# Patient Record
Sex: Female | Born: 1975 | Race: White | Hispanic: No | Marital: Married | Smoking: Former smoker
Health system: Southern US, Community
[De-identification: ages and names within clinical notes are randomized; demographics above are authoritative.]

## PROBLEM LIST (undated history)

## (undated) DIAGNOSIS — F32A Depression, unspecified: Secondary | ICD-10-CM

## (undated) DIAGNOSIS — K819 Cholecystitis, unspecified: Secondary | ICD-10-CM

## (undated) DIAGNOSIS — Z8249 Family history of ischemic heart disease and other diseases of the circulatory system: Secondary | ICD-10-CM

## (undated) DIAGNOSIS — K121 Other forms of stomatitis: Secondary | ICD-10-CM

## (undated) DIAGNOSIS — F419 Anxiety disorder, unspecified: Secondary | ICD-10-CM

## (undated) DIAGNOSIS — F329 Major depressive disorder, single episode, unspecified: Secondary | ICD-10-CM

## (undated) DIAGNOSIS — M797 Fibromyalgia: Secondary | ICD-10-CM

## (undated) HISTORY — PX: SHOULDER ARTHROSCOPY: SHX128

## (undated) HISTORY — DX: Anxiety disorder, unspecified: F41.9

## (undated) HISTORY — DX: Other forms of stomatitis: K12.1

## (undated) HISTORY — DX: Fibromyalgia: M79.7

## (undated) HISTORY — DX: Depression, unspecified: F32.A

## (undated) HISTORY — PX: KNEE ARTHROSCOPY: SUR90

## (undated) HISTORY — PX: GANGLION CYST EXCISION: SHX1691

## (undated) HISTORY — DX: Major depressive disorder, single episode, unspecified: F32.9

---

## 2005-03-02 ENCOUNTER — Other Ambulatory Visit: Admission: RE | Admit: 2005-03-02 | Discharge: 2005-03-02 | Payer: Self-pay | Admitting: Obstetrics and Gynecology

## 2005-06-10 ENCOUNTER — Ambulatory Visit (HOSPITAL_COMMUNITY): Admission: RE | Admit: 2005-06-10 | Discharge: 2005-06-10 | Payer: Self-pay | Admitting: Orthopedic Surgery

## 2005-06-22 ENCOUNTER — Ambulatory Visit (HOSPITAL_COMMUNITY): Admission: RE | Admit: 2005-06-22 | Discharge: 2005-06-22 | Payer: Self-pay | Admitting: Orthopedic Surgery

## 2005-06-23 ENCOUNTER — Ambulatory Visit (HOSPITAL_BASED_OUTPATIENT_CLINIC_OR_DEPARTMENT_OTHER): Admission: RE | Admit: 2005-06-23 | Discharge: 2005-06-23 | Payer: Self-pay | Admitting: Orthopedic Surgery

## 2005-07-21 ENCOUNTER — Ambulatory Visit (HOSPITAL_BASED_OUTPATIENT_CLINIC_OR_DEPARTMENT_OTHER): Admission: RE | Admit: 2005-07-21 | Discharge: 2005-07-21 | Payer: Self-pay | Admitting: Orthopedic Surgery

## 2007-03-20 ENCOUNTER — Inpatient Hospital Stay (HOSPITAL_COMMUNITY): Admission: AD | Admit: 2007-03-20 | Discharge: 2007-03-22 | Payer: Self-pay | Admitting: Obstetrics and Gynecology

## 2008-05-11 ENCOUNTER — Inpatient Hospital Stay (HOSPITAL_COMMUNITY): Admission: AD | Admit: 2008-05-11 | Discharge: 2008-05-13 | Payer: Self-pay | Admitting: Obstetrics & Gynecology

## 2010-05-31 ENCOUNTER — Encounter: Payer: Self-pay | Admitting: Orthopedic Surgery

## 2010-08-24 LAB — CBC
HCT: 26.6 % — ABNORMAL LOW (ref 36.0–46.0)
HCT: 27.2 % — ABNORMAL LOW (ref 36.0–46.0)
HCT: 31.9 % — ABNORMAL LOW (ref 36.0–46.0)
Hemoglobin: 10.8 g/dL — ABNORMAL LOW (ref 12.0–15.0)
Hemoglobin: 9.2 g/dL — ABNORMAL LOW (ref 12.0–15.0)
Hemoglobin: 9.4 g/dL — ABNORMAL LOW (ref 12.0–15.0)
MCHC: 33.9 g/dL (ref 30.0–36.0)
MCHC: 34.4 g/dL (ref 30.0–36.0)
MCHC: 34.6 g/dL (ref 30.0–36.0)
MCV: 96.2 fL (ref 78.0–100.0)
MCV: 96.4 fL (ref 78.0–100.0)
MCV: 96.4 fL (ref 78.0–100.0)
Platelets: 240 10*3/uL (ref 150–400)
Platelets: 265 10*3/uL (ref 150–400)
Platelets: 291 10*3/uL (ref 150–400)
RBC: 2.76 MIL/uL — ABNORMAL LOW (ref 3.87–5.11)
RBC: 2.82 MIL/uL — ABNORMAL LOW (ref 3.87–5.11)
RBC: 3.31 MIL/uL — ABNORMAL LOW (ref 3.87–5.11)
RDW: 12.6 % (ref 11.5–15.5)
RDW: 12.6 % (ref 11.5–15.5)
RDW: 12.6 % (ref 11.5–15.5)
WBC: 11.2 10*3/uL — ABNORMAL HIGH (ref 4.0–10.5)
WBC: 8 10*3/uL (ref 4.0–10.5)
WBC: 9.8 10*3/uL (ref 4.0–10.5)

## 2010-08-24 LAB — DIFFERENTIAL
Basophils Absolute: 0 10*3/uL (ref 0.0–0.1)
Basophils Relative: 0 % (ref 0–1)
Eosinophils Absolute: 0.2 10*3/uL (ref 0.0–0.7)
Eosinophils Relative: 2 % (ref 0–5)
Lymphocytes Relative: 23 % (ref 12–46)
Lymphs Abs: 1.8 10*3/uL (ref 0.7–4.0)
Monocytes Absolute: 0.8 10*3/uL (ref 0.1–1.0)
Monocytes Relative: 10 % (ref 3–12)
Neutro Abs: 5.2 10*3/uL (ref 1.7–7.7)
Neutrophils Relative %: 65 % (ref 43–77)

## 2010-08-24 LAB — RPR: RPR Ser Ql: NONREACTIVE

## 2010-09-25 NOTE — Op Note (Signed)
NAME:  Autumn Dixon, Autumn Dixon                 ACCOUNT NO.:  [id number]   MEDICAL RECORD NO.:  [id number]          PATIENT TYPE:  AMB   LOCATION:  DSC                          FACILITY:  [facility]   PHYSICIAN:  Harvie Junior, M.D.   DATE OF BIRTH:  1976-05-07   DATE OF PROCEDURE:  07/21/2005  DATE OF DISCHARGE:                                 OPERATIVE REPORT   PREOPERATIVE DIAGNOSIS:  Impingement acromioclavicular joint arthritis,  right shoulder.  Ganglion cyst, dorsal wrist.  Catching-locking sensation,  right elbow.   POSTOPERATIVE DIAGNOSIS:  Impingement acromioclavicular joint arthritis,  right shoulder.  Ganglion cyst, dorsal wrist.  Catching-locking sensation,  right elbow.   OPERATION PERFORMED:  1.  Subacromial decompression, arthroscopic.  2.  Distal clavicle resection, arthroscopic.  3.  Debridement of subacromial space, arthroscopic.  4.  Excision of dorsal ganglion cyst, right wrist.  5.  Injection into the right elbow with 4 of 0.5% Marcaine with 1 mL of 80      mg per mL Depo-Medrol.   SURGEON:  Harvie Junior, M.D.   ASSISTANT:  Marshia Ly, P.A.   ANESTHESIA:  General.   INDICATIONS FOR PROCEDURE:  Autumn Dixon is a 35 year old female with a long  history of having significant impingement, pain on the right side.  She was  evaluated with MRI, failed injection therapy and because of continued  complaints of pain was ultimately taken to the operating room for  subacromial decompression, distal clavicle resection.  She was noted in  preoperative work-up to have catching and popping in the right elbow and we  felt that there was maybe some ligamentous issue going on here and felt that  injection therapy might help and thought that we would inject this while she  was under anesthesia.  The patient also had right wrist pain and had a  gadolinium MR preoperatively that showed no ligamentous injury but did show  a significant dorsal ganglion under the extensor retinaculum.  It  was felt  that we might as well excise this at the time of her surgery as well and she  is brought to the operating room for these procedures.   DESCRIPTION OF PROCEDURE:  The patient was brought to the operating room and  after adequate anesthesia was obtained with general anesthetic, patient  placed supine on the operating table.  The right elbow was then injected  with 4 mL of 0.5% Marcaine with 1 mL of 80 mg/mL Depo-Medrol.  Pressure was  held on this while the patient was being positioned for her remaining  surgical procedures.  The patient was then placed in the beach chair  position.  All bony prominences were well padded.  Attention was then turned  to the right arm which was prepped and draped in the usual sterile fashion.  Following this, attention was turned to the right wrist where sterile  tourniquet was placed  on the forearm and the arm was exsanguinated and the  blood pressure tourniquet was inflated to 300 mmHg.  Following this, a small  incision was made over the dorsal aspect  of the wrist.  Subcutaneous tissues  were dissected down to the level of the extensor tendons.  The extensor  retinaculum was divided and the tendons were retracted medial and laterally  giving access to the large ganglion cyst under this area.  At this point the  ganglion was excised in total opening up the dorsal wrist capsule.  The  scapholunate ligament could be identified at this point.  The edges of the  opening in the capsule were cauterized.  At this point the wound was  copiously irrigated and suctioned dry.  The skin was closed with 3-0  Mersilene subcuticular stitch and benzoin and Steri-Strips were applied.  Tourniquet was let down and sterile compressive dressing was applied.  Attention was then turned to the right shoulder where routine arthroscopic  examination of the shoulder revealed that there was no significant  glenohumeral pathology.  There was no arthritic change.  There was no  soft  tissue bicipital tendon problems.  The labrum looked good.  The undersurface  of the rotator cuff looked good.  Attention was turned now to the  glenohumeral joint and the subacromial space.  Obvious significant spurring  of the anterolateral acromion which was debrided by way of acromioplasty  from a lateral and posterior compartment.  Following this, attention was  turned to the distal clavicle where 17 mm of distal clavicle was resected  from the anterior compartment.  At this point the rotator cuff was evaluated  thoroughly from the superior surface, no evidence of partial or full  thickness tearing was identified.  There was a little bit of raggedness on  the rotator cuff which was debrided but nothing that seemed of any  significant.  Subtotal bursectomy was performed and at this point the  shoulder was copiously irrigated and suctioned dry.  The arthroscopic  portals were closed with a bandage.  A sterile compressive dressing was  applied.  The patient was taken to the recovery room where she was noted to  be in satisfactory condition.  The estimated blood loss for this procedure  was none.      Harvie Junior, M.D.  Electronically Signed     JLG/MEDQ  D:  07/21/2005  T:  07/22/2005  Job:  [id number]

## 2010-09-25 NOTE — Op Note (Signed)
NAMETEQUITA, Autumn Dixon                 ACCOUNT NO.:  [id number]   MEDICAL RECORD NO.:  [id number]          PATIENT TYPE:  AMB   LOCATION:  DSC                          FACILITY:  [facility]   PHYSICIAN:  Harvie Junior, M.D.   DATE OF BIRTH:  10/02/1975   DATE OF PROCEDURE:  06/23/2005  DATE OF DISCHARGE:                                 OPERATIVE REPORT   PREOPERATIVE DIAGNOSIS:  Patellofemoral pain syndrome.   POSTOPERATIVE DIAGNOSES:  1.  Lateral bucket handle meniscal tear.  2.  Chondromalacia patellofemoral trochlea.   OPERATION PERFORMED:  1.  Lateral bucket handle meniscal resection.  2.  Debridement of chondromalacia of patellofemoral joint.   SURGEON:  Harvie Junior, M.D.   ASSISTANT:  Marshia Ly, P.A.   ANESTHESIA:  General.   INDICATIONS FOR PROCEDURE:  Autumn Dixon is a 35 year old female with a long  history of having multiple joint complaints.  She ultimately had prolonged  complaints with the right knee with intermittent catching locking and pain.  Because of this, we felt that she needed a right knee arthroscopy.  She also  has had multiple systemic complaints and we were concerned about the  possibility of her having some sort of early arthritic kind of condition.  At this point we were going to have her evaluated by rheumatologist just to  kind of see but felt that she did need a knee scope just based on her  symptoms and duration of symptoms.  The patient was brought to the operating  room for that procedure.   DESCRIPTION OF PROCEDURE:  The patient was brought to the operating room and  after adequate anesthesia obtained with general anesthetic, the patient was  placed supine position on operating table.  Right leg prepped and draped in  the usual sterile fashion.  Following this routine arthroscopic examination  of the knee revealed there was an obvious bucket handle lateral meniscal  tear which was debrided, was released and taken out in total.  Remaining  meniscal rim was contoured down with a suction shaver.  The articular  surfaces looked reasonably well.  There was a little bit of articular  cartilage floating but nothing dramatic.  There was some grade 2 changes in  the patellofemoral trochlea and a probe could easily go down about 3 mm deep  and there we debrided this minimally.  Patellofemoral tracking was midline.  There was a lot of patellofemoral compression.  Once these two procedures  were completed,  the knee was copiously irrigated and suctioned dry.  The arthroscopic  portals were closed with a bandage. Sterile compressive dressing was  applied.  The patient was taken to the recovery room where she was noted to  be in satisfactory condition.  The estimated blood loss for this procedure  was none.      Harvie Junior, M.D.  Electronically Signed     JLG/MEDQ  D:  06/23/2005  T:  06/23/2005  Job:  [id number]

## 2011-02-16 LAB — CBC
HCT: 27.6 — ABNORMAL LOW
HCT: 34.5 — ABNORMAL LOW
Hemoglobin: 11.8 — ABNORMAL LOW
Hemoglobin: 9.6 — ABNORMAL LOW
MCHC: 34.2
MCHC: 34.7
MCV: 95.8
MCV: 96.5
Platelets: 247
Platelets: 334
RBC: 2.88 — ABNORMAL LOW
RBC: 3.58 — ABNORMAL LOW
RDW: 12.6
RDW: 12.7
WBC: 10.2
WBC: 11.1 — ABNORMAL HIGH

## 2011-02-16 LAB — COMPREHENSIVE METABOLIC PANEL
ALT: 15
AST: 21
Albumin: 2.8 — ABNORMAL LOW
Alkaline Phosphatase: 196 — ABNORMAL HIGH
BUN: 2 — ABNORMAL LOW
CO2: 23
Calcium: 9.1
Chloride: 104
Creatinine, Ser: 0.49
GFR calc Af Amer: >60
GFR calc non Af Amer: >60
Glucose, Bld: 97
Potassium: 3.9
Sodium: 135
Total Bilirubin: 0.7
Total Protein: 5.8 — ABNORMAL LOW

## 2011-02-16 LAB — RPR: RPR Ser Ql: NONREACTIVE

## 2011-02-16 LAB — CCBB MATERNAL DONOR DRAW

## 2011-02-16 LAB — LACTATE DEHYDROGENASE: LDH: 171

## 2011-02-16 LAB — URIC ACID: Uric Acid, Serum: 4.4

## 2011-04-21 ENCOUNTER — Ambulatory Visit (INDEPENDENT_AMBULATORY_CARE_PROVIDER_SITE_OTHER): Payer: BC Managed Care – PPO

## 2011-04-21 DIAGNOSIS — L299 Pruritus, unspecified: Secondary | ICD-10-CM

## 2011-04-21 DIAGNOSIS — B850 Pediculosis due to Pediculus humanus capitis: Secondary | ICD-10-CM

## 2011-05-27 ENCOUNTER — Ambulatory Visit: Payer: BC Managed Care – PPO | Admitting: Family Medicine

## 2011-06-03 ENCOUNTER — Ambulatory Visit (INDEPENDENT_AMBULATORY_CARE_PROVIDER_SITE_OTHER): Payer: BC Managed Care – PPO | Admitting: Family Medicine

## 2011-06-03 DIAGNOSIS — F411 Generalized anxiety disorder: Secondary | ICD-10-CM

## 2012-06-15 ENCOUNTER — Other Ambulatory Visit: Payer: Self-pay | Admitting: Family Medicine

## 2012-06-26 ENCOUNTER — Telehealth: Payer: Self-pay

## 2012-06-26 NOTE — Telephone Encounter (Signed)
This patient is scheduled to see dr Milus Glazier on 07/26/12 @1 :00, however she is out of one of her medications, sertraline and is requesting a refill on this medication, until her re-eval with dr Armond Hang.

## 2012-06-29 ENCOUNTER — Telehealth: Payer: Self-pay

## 2012-06-29 MED ORDER — SERTRALINE HCL 100 MG PO TABS: 100.0000 mg | ORAL_TABLET | Freq: Every day | ORAL | Status: DC

## 2012-06-29 NOTE — Telephone Encounter (Signed)
Called patient to advise  °

## 2012-06-29 NOTE — Telephone Encounter (Signed)
Please advise patient is scheduled for 07/26/12 with Dr Milus Glazier, she has been on Sertraline 100 mg, chart pulled it is at [facility]. She was here last Jun 03 2011. Rx pended.

## 2012-06-29 NOTE — Telephone Encounter (Signed)
See note below, I do not think this message was routed correctly.  Pt called again today 2/20 and is awaiting her refill  Pt [phone] office #

## 2012-06-29 NOTE — Telephone Encounter (Signed)
Rx done. Must have OV for additional refills

## 2012-07-26 ENCOUNTER — Encounter: Payer: Self-pay | Admitting: Family Medicine

## 2012-07-26 ENCOUNTER — Ambulatory Visit (INDEPENDENT_AMBULATORY_CARE_PROVIDER_SITE_OTHER): Payer: BC Managed Care – PPO | Admitting: Family Medicine

## 2012-07-26 VITALS — BP 120/76 | HR 86 | Temp 98.5°F | Resp 16 | Ht 69.0 in | Wt 183.0 lb

## 2012-07-26 DIAGNOSIS — F411 Generalized anxiety disorder: Secondary | ICD-10-CM

## 2012-07-26 DIAGNOSIS — G4700 Insomnia, unspecified: Secondary | ICD-10-CM

## 2012-07-26 MED ORDER — SERTRALINE HCL 100 MG PO TABS: 100.0000 mg | ORAL_TABLET | Freq: Every day | ORAL | Status: DC

## 2012-07-26 MED ORDER — ALPRAZOLAM 2 MG PO TABS: ORAL_TABLET | ORAL | Status: DC

## 2012-07-26 NOTE — Patient Instructions (Addendum)

## 2012-07-26 NOTE — Progress Notes (Signed)
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 Her youngest of 11 and 46 years old. She's had a problem with mild anxiety and insomnia which has been well-controlled with the sertraline and alprazolam. She takes alprazolam 2 mg in the morning and 1 mg at night. Generally she goes to bed around 7:30 or 8 and has trouble with frequent awakening, approximately every 2 hours. She gets up at 4:00 every for work.  Her main concern now is her weight gain. She finds it when she wakes up at night she snacks with parts and other snacks.  Otherwise patient is comfortable with her life is going and not in any acute problems.  Objective: Discussed the weight gain for about 15 minutes suggested the patient try either her blood Bank at night or twitching around her alprazolam so that she's taking 1 mg in the morning and 2 mg at night to help her sleep better.  Assessment: Relatively stable at the present time, no need for new lab work  Plan: Refill meds for 5 months and return to review weight situation at that time.

## 2012-08-09 ENCOUNTER — Telehealth: Payer: Self-pay

## 2012-08-09 NOTE — Telephone Encounter (Signed)
Dr L. Patient needs call back to  Discuss meds. [phone]

## 2012-08-10 ENCOUNTER — Other Ambulatory Visit: Payer: Self-pay | Admitting: Family Medicine

## 2012-08-10 DIAGNOSIS — F32A Depression, unspecified: Secondary | ICD-10-CM

## 2012-08-10 DIAGNOSIS — F329 Major depressive disorder, single episode, unspecified: Secondary | ICD-10-CM

## 2012-08-10 MED ORDER — BUPROPION HCL ER (XL) 150 MG PO TB24: 150.0000 mg | ORAL_TABLET | Freq: Every day | ORAL | Status: DC

## 2012-08-10 NOTE — Telephone Encounter (Signed)
Spoke to patient, she was here recently, she states she has been on Zoloft for years, sexual side effects with this and weigh gain (15 lbs) wants to know if she can change to Wellbutrin, please advise.

## 2013-01-24 ENCOUNTER — Other Ambulatory Visit: Payer: Self-pay | Admitting: Family Medicine

## 2013-01-24 NOTE — Telephone Encounter (Signed)
PATIENT WOULD LIKE A REFILL ON HER XANEX IF POSSIBLE DR L IS HER DOCTOR [phone]

## 2013-02-27 ENCOUNTER — Telehealth: Payer: Self-pay

## 2013-02-27 DIAGNOSIS — K649 Unspecified hemorrhoids: Secondary | ICD-10-CM

## 2013-02-27 MED ORDER — HYDROCORTISONE 2.5 % RE CREA: TOPICAL_CREAM | Freq: Two times a day (BID) | RECTAL | Status: DC

## 2013-02-27 NOTE — Telephone Encounter (Signed)
PATIENT STATES SHE WOULD  LIKE DR. Milus Glazier TO PRESCRIBE SOMETHING "PRESCRIPTION STRENGTH" FOR HEMORRHOIDS THAT HAS BEEN BOTHERING HER FOR 1 MONTH. OVER-THE-COUNTER MEDICATIONS ARE NOT HELPING. BEST PHONE [phone] (CELL)    PHARMACY CHOICE IS [facility] ON [address].  MBC

## 2013-02-28 ENCOUNTER — Ambulatory Visit (INDEPENDENT_AMBULATORY_CARE_PROVIDER_SITE_OTHER): Payer: BC Managed Care – PPO | Admitting: Family Medicine

## 2013-02-28 VITALS — BP 116/66 | HR 75 | Temp 98.5°F | Resp 18 | Ht 69.0 in | Wt 177.6 lb

## 2013-02-28 DIAGNOSIS — K649 Unspecified hemorrhoids: Secondary | ICD-10-CM

## 2013-02-28 DIAGNOSIS — G4700 Insomnia, unspecified: Secondary | ICD-10-CM

## 2013-02-28 DIAGNOSIS — F411 Generalized anxiety disorder: Secondary | ICD-10-CM

## 2013-02-28 MED ORDER — ALPRAZOLAM 2 MG PO TABS: ORAL_TABLET | ORAL | Status: DC

## 2013-02-28 MED ORDER — SERTRALINE HCL 100 MG PO TABS: 100.0000 mg | ORAL_TABLET | Freq: Every day | ORAL | Status: DC

## 2013-02-28 MED ORDER — STARCH 51 % RE SUPP: 1.0000 | RECTAL | Status: DC | PRN

## 2013-02-28 NOTE — Progress Notes (Signed)
Patient ID: Autumn Dixon, female   DOB: 07-28-1975, 37 y.o.   MRN: [id number]  @UMFCLOGO @  Patient ID: Autumn Dixon MRN: [id number], DOB: [birth year], 37 y.o. Date of Encounter: 02/28/2013, 10:00 AM This chart was scribed for Elvina Sidle, MD by Valera Castle, ED Scribe. This patient was seen in room 2 and the patient's care was started at 10:00 AM.   Primary Physician: Elvina Sidle, MD  Chief Complaint: Rx Refill, Hemorrhoids  HPI: 37 y.o. year old female with history below presents to the [facility] for a Rx refill of Xanax and Zoloft. She also reports persistent, intermittent, severely painful hemorrhoids, onset 14 years ago, after having her 2nd child. She reports that they come and go, about 1 every few months, and that they last for several days. She states the pain will force her to leave work at times. She reports that that the hemorrhoids are sometimes bloody, but that the pain is her main symptom. She states that she has been to [facility] for sprays, creams, wipes, and numerous other treatments, and trying ice and salt baths, all with no relief.    Past Medical History  Diagnosis Date  . Anxiety   . Depression      Home Meds: Prior to Admission medications   Medication Sig Start Date End Date Taking? Authorizing Provider  alprazolam Prudy Feeler) 2 MG tablet 1/2 tab in am and 1 tab at hs 07/26/12  Yes Elvina Sidle, MD  buPROPion (WELLBUTRIN XL) 150 MG 24 hr tablet Take 1 tablet (150 mg total) by mouth daily. 08/10/12  Yes Elvina Sidle, MD  hydrocortisone (ANUSOL-HC) 2.5 % rectal cream Place rectally 2 (two) times daily. 02/27/13   Elvina Sidle, MD    Allergies: No Known Allergies  History   Social History  . Marital Status: Married    Spouse Name: N/A    Number of Children: N/A  . Years of Education: N/A   Occupational History  . Not on file.   Social History Main Topics  . Smoking status: Never Smoker   . Smokeless tobacco: Not on file  . Alcohol Use: Not on file  .  Drug Use: Not on file  . Sexual Activity: Not on file   Other Topics Concern  . Not on file   Social History Narrative  . No narrative on file     Review of Systems: Constitutional: negative for chills, fever, night sweats, weight changes, or fatigue  HEENT: negative for vision changes, hearing loss, congestion, rhinorrhea, ST, epistaxis, or sinus pressure Cardiovascular: negative for chest pain or palpitations Respiratory: negative for hemoptysis, wheezing, shortness of breath, or cough Abdominal: negative for abdominal pain, nausea, vomiting, diarrhea, or constipation GU: Positive for severely painful, sometimes bloody, intermittent hemorrhoids  Dermatological: negative for rash Neurologic: negative for headache, dizziness, or syncope All other systems reviewed and are otherwise negative with the exception to those above and in the HPI.   Physical Exam: Blood pressure 116/66, pulse 75, temperature 98.5 F (36.9 C), temperature source Oral, resp. rate 18, height 5\' 9"  (1.753 m), weight 177 lb 9.6 oz (80.559 kg), SpO2 99.00%., Body mass index is 26.22 kg/(m^2). General: Well developed, well nourished, in no acute distress. Head: Normocephalic, atraumatic, eyes without discharge, sclera non-icteric, nares are without discharge. Bilateral auditory canals clear, TM's are without perforation, pearly grey and translucent with reflective cone of light bilaterally. Oral cavity moist, posterior pharynx without exudate, erythema, peritonsillar abscess, or post nasal drip.  Neck: Supple. No thyromegaly. Full ROM.  No lymphadenopathy. Lungs: Clear bilaterally to auscultation without wheezes, rales, or rhonchi. Breathing is unlabored. Heart: RRR with S1 S2. No murmurs, rubs, or gallops appreciated. Abdomen: Soft, non-tender, non-distended with normoactive bowel sounds. No hepatomegaly. No rebound/guarding. No obvious abdominal masses. Msk:  Strength and tone normal for age. Extremities/Skin: Warm  and dry. No clubbing or cyanosis. No edema. No rashes or suspicious lesions. Neuro: Alert and oriented X 3. Moves all extremities spontaneously. Gait is normal. CNII-XII grossly in tact. Psych:  Responds to questions appropriately with a normal affect.  Large red left sided 3/4 cm violaceous external hemorrhoid with right sided ecchymotic area at anal verge DIAGNOSTIC STUDIES: Oxygen Saturation is 99% on room air, normal by my interpretation.    Labs:  No orders of the defined types were placed in this encounter.    ASSESSMENT AND PLAN:  37 y.o. year old female with    Signed, Elvina Sidle, MD 02/28/2013 9:59 AM

## 2013-03-06 ENCOUNTER — Telehealth: Payer: Self-pay

## 2013-03-06 MED ORDER — HYDROCORTISONE ACETATE 25 MG RE SUPP: 25.0000 mg | Freq: Two times a day (BID) | RECTAL | Status: DC

## 2013-03-06 NOTE — Telephone Encounter (Signed)
Meds ordered this encounter  Medications  . hydrocortisone (ANUSOL-HC) 25 MG suppository    Sig: Place 1 suppository (25 mg total) rectally 2 (two) times daily.    Dispense:  12 suppository    Refill:  1    Order Specific Question:  Supervising Provider    Answer:  DOOLITTLE, ROBERT P [[id number]]

## 2013-03-06 NOTE — Telephone Encounter (Signed)
Pt advised.

## 2013-03-06 NOTE — Telephone Encounter (Signed)
PT WAS SEEN FOR HEMORRHOIDS AND GIVEN A SUPPORTIVE WHICH THEY DO NOT MAKE ANY MORE, SHE HAVE CALLED AROUND TO ALL PHARMACIES AND TOLD THE SAME THING [facility] STATES THEY MAKE ANUCORT HC 30MG S WHICH IS ABOUT THE SAME. SHE IS IN A LOT OF PAIN AND NEED TO HAVE SOME KIND OF RELIEF ASAP PLEASE CALL [phone] OR [phone]   [facility]

## 2013-10-31 ENCOUNTER — Other Ambulatory Visit: Payer: Self-pay | Admitting: Family Medicine

## 2013-11-09 ENCOUNTER — Other Ambulatory Visit: Payer: Self-pay | Admitting: Family Medicine

## 2013-12-24 ENCOUNTER — Encounter: Payer: Self-pay | Admitting: Family Medicine

## 2013-12-24 ENCOUNTER — Ambulatory Visit (INDEPENDENT_AMBULATORY_CARE_PROVIDER_SITE_OTHER): Payer: BC Managed Care – PPO | Admitting: Family Medicine

## 2013-12-24 VITALS — BP 111/71 | HR 76 | Temp 98.0°F | Resp 16 | Ht 70.5 in | Wt 188.0 lb

## 2013-12-24 DIAGNOSIS — Z3041 Encounter for surveillance of contraceptive pills: Secondary | ICD-10-CM

## 2013-12-24 DIAGNOSIS — G4700 Insomnia, unspecified: Secondary | ICD-10-CM

## 2013-12-24 DIAGNOSIS — IMO0001 Reserved for inherently not codable concepts without codable children: Secondary | ICD-10-CM | POA: Insufficient documentation

## 2013-12-24 DIAGNOSIS — F411 Generalized anxiety disorder: Secondary | ICD-10-CM

## 2013-12-24 MED ORDER — ALPRAZOLAM 2 MG PO TABS: ORAL_TABLET | ORAL | Status: DC

## 2013-12-24 MED ORDER — SERTRALINE HCL 100 MG PO TABS: 100.0000 mg | ORAL_TABLET | Freq: Every day | ORAL | Status: DC

## 2013-12-24 NOTE — Progress Notes (Signed)
Patient ID: Autumn Dixon MRN: [id number], DOB: December 01, 1975, 38 y.o. Date of Encounter: 12/24/2013, 10:16 AM  This chart was scribed for Elvina SidleKurt Braxden Lovering, MD by Julian HyMorgan Graham, ED Scribe. The patient was seen in Room 29. The patient's care was started at 10:16 AM.   Primary Physician: Elvina SidleLAUENSTEIN,Sarha Bartelt, MD  Chief Complaint: Medication refill   HPI: 38 y.o. year old female with history below presents for refill of 2 mg Xanax and 100 mg Zoloft. Doing well without issues or complaints. Taking medication daily without adverse effects. Has been on medication for anxiety and depression.   Pt reports her oldest daughter is leaving for [employer] on 8/19. Pt reports she was hit in the right eye by her dog's rawhide bone when her dog was startled. Pt reports she couldn't see right after. Pt reports using ice compress and states her symptoms have resolved. Pt reports some bruising around her right eye. 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  Pt denies any other symptoms.   Past Medical History  Diagnosis Date  . Anxiety   . Depression      Home Meds: Prior to Admission medications   Medication Sig Start Date End Date Taking? Authorizing Provider  alprazolam Prudy Feeler(XANAX) 2 MG tablet 1/2 tab in am and 1 tab at hs 02/28/13  Yes Elvina SidleKurt Tyhir Schwan, MD  levonorgestrel (MIRENA) 20 MCG/24HR IUD 1 each by Intrauterine route once.   Yes Historical Provider, MD  sertraline (ZOLOFT) 100 MG tablet Take 1 tablet (100 mg total) by mouth daily. Needs office visit for additional refills 02/28/13  Yes Elvina SidleKurt Rameen Quinney, MD  hydrocortisone (ANUSOL-HC) 2.5 % rectal cream Place rectally 2 (two) times daily. 02/27/13   Elvina SidleKurt Brunella Wileman, MD  hydrocortisone (ANUSOL-HC) 25 MG suppository Place 1 suppository (25 mg total) rectally 2 (two) times daily. 03/06/13   Chelle Tessa LernerS Jeffery, PA-C    Allergies: No Known Allergies  History   Social History  . Marital Status: Married    Spouse Name: N/A    Number  of Children: N/A  . Years of Education: N/A   Occupational History  . Not on file.   Social History Main Topics  . Smoking status: Never Smoker   . Smokeless tobacco: Not on file  . Alcohol Use: Not on file  . Drug Use: Not on file  . Sexual Activity: Not on file   Other Topics Concern  . Not on file   Social History Narrative  . No narrative on file     Review of Systems: Constitutional: negative for chills, fever, night sweats, weight changes, or fatigue  HEENT: negative for vision changes, hearing loss, congestion, rhinorrhea, ST, epistaxis, or sinus pressure Cardiovascular: negative for chest pain or palpitations Respiratory: negative for hemoptysis, wheezing, shortness of breath, or cough Abdominal: negative for abdominal pain, nausea, vomiting, diarrhea, or constipation Dermatological: negative for rash. Positive for bruising (right eye). Neurologic: negative for headache, dizziness, or syncope All other systems reviewed and are otherwise negative with the exception to those above and in the HPI.   Physical Exam: Blood pressure 111/71, pulse 76, temperature 98 F (36.7 C), temperature source Oral, resp. rate 16, height 5' 10.5" (1.791 m), weight 188 lb (85.276 kg), SpO2 97.00%., Body mass index is 26.58 kg/(m^2). General: Well developed, well nourished, in no acute distress. Head: Normocephalic, atraumatic, eyes without discharge, sclera non-icteric, nares are without discharge. Bilateral auditory canals clear, TM's are without perforation, pearly grey and translucent with reflective cone of  light bilaterally. Oral cavity moist, posterior pharynx without exudate, erythema, peritonsillar abscess, or post nasal drip. Pt has ecchymosis underneath her right eye. Normal fundi.  Neck: Supple. No thyromegaly. Full ROM. No lymphadenopathy. Lungs: Clear bilaterally to auscultation without wheezes, rales, or rhonchi. Breathing is unlabored. Heart: RRR with S1 S2. No murmurs, rubs,  or gallops appreciated. Abdomen: Soft, non-tender, non-distended with normoactive bowel sounds. No hepatosplenomegalymegaly. No rebound/guarding. No obvious abdominal masses. Msk:  Strength and tone normal for age. Extremities/Skin: Warm and dry. No clubbing or cyanosis. No edema. No rashes or suspicious lesions. Neuro: Alert and oriented X 3. Moves all extremities spontaneously. Gait is normal. CNII-XII grossly in tact. Psych:  Responds to questions appropriately with a normal affect.   Labs:   ASSESSMENT AND PLAN:  38 y.o. year old female with anxiety here for medication refill. - Anxiety state, unspecified - Plan: sertraline (ZOLOFT) 100 MG tablet  Insomnia - Plan: alprazolam Prudy Feeler) 2 MG tablet  Encounter for surveillance of contraceptive pills   Signed, Elvina Sidle, MD 12/24/2013 10:24 AM

## 2014-01-30 ENCOUNTER — Ambulatory Visit (INDEPENDENT_AMBULATORY_CARE_PROVIDER_SITE_OTHER): Payer: BC Managed Care – PPO | Admitting: Family Medicine

## 2014-01-30 VITALS — BP 102/68 | HR 69 | Temp 98.2°F | Resp 18 | Ht 69.0 in | Wt 193.0 lb

## 2014-01-30 DIAGNOSIS — M545 Low back pain, unspecified: Secondary | ICD-10-CM

## 2014-01-30 DIAGNOSIS — R5381 Other malaise: Secondary | ICD-10-CM

## 2014-01-30 DIAGNOSIS — J029 Acute pharyngitis, unspecified: Secondary | ICD-10-CM

## 2014-01-30 DIAGNOSIS — R5383 Other fatigue: Secondary | ICD-10-CM

## 2014-01-30 DIAGNOSIS — R599 Enlarged lymph nodes, unspecified: Secondary | ICD-10-CM

## 2014-01-30 DIAGNOSIS — J0120 Acute ethmoidal sinusitis, unspecified: Secondary | ICD-10-CM

## 2014-01-30 DIAGNOSIS — R590 Localized enlarged lymph nodes: Secondary | ICD-10-CM

## 2014-01-30 LAB — COMPLETE METABOLIC PANEL WITH GFR
ALT: 14 U/L (ref 0–35)
AST: 18 U/L (ref 0–37)
Albumin: 4.5 g/dL (ref 3.5–5.2)
Alkaline Phosphatase: 62 U/L (ref 39–117)
BUN: 5 mg/dL — ABNORMAL LOW (ref 6–23)
CO2: 28 mEq/L (ref 19–32)
Calcium: 9.8 mg/dL (ref 8.4–10.5)
Chloride: 104 mEq/L (ref 96–112)
Creat: 0.58 mg/dL (ref 0.50–1.10)
GFR, Est African American: >89 mL/min
GFR, Est Non African American: >89 mL/min
Glucose, Bld: 84 mg/dL (ref 70–99)
Potassium: 4.4 mEq/L (ref 3.5–5.3)
Sodium: 139 mEq/L (ref 135–145)
Total Bilirubin: 0.5 mg/dL (ref 0.2–1.2)
Total Protein: 7.1 g/dL (ref 6.0–8.3)

## 2014-01-30 LAB — POCT UA - MICROSCOPIC ONLY
Casts, Ur, LPF, POC: NEGATIVE
Crystals, Ur, HPF, POC: NEGATIVE
Mucus, UA: NEGATIVE
Yeast, UA: NEGATIVE

## 2014-01-30 LAB — POCT CBC
Granulocyte percent: 67.7 %G (ref 37–80)
HCT, POC: 41.1 % (ref 37.7–47.9)
Hemoglobin: 13.6 g/dL (ref 12.2–16.2)
Lymph, poc: 2.1 (ref 0.6–3.4)
MCH, POC: 31.2 pg (ref 27–31.2)
MCHC: 33.1 g/dL (ref 31.8–35.4)
MCV: 94.2 fL (ref 80–97)
MID (cbc): 0.3 (ref 0–0.9)
MPV: 7.7 fL (ref 0–99.8)
POC Granulocyte: 4.9 (ref 2–6.9)
POC LYMPH PERCENT: 28.3 %L (ref 10–50)
POC MID %: 4 %M (ref 0–12)
Platelet Count, POC: 314 10*3/uL (ref 142–424)
RBC: 4.36 M/uL (ref 4.04–5.48)
RDW, POC: 13.4 %
WBC: 7.3 10*3/uL (ref 4.6–10.2)

## 2014-01-30 LAB — POCT URINALYSIS DIPSTICK
Bilirubin, UA: NEGATIVE
Blood, UA: NEGATIVE
Glucose, UA: NEGATIVE
Ketones, UA: NEGATIVE
Leukocytes, UA: NEGATIVE
Nitrite, UA: NEGATIVE
Protein, UA: NEGATIVE
Spec Grav, UA: <=1.005
Urobilinogen, UA: 0.2
pH, UA: 7

## 2014-01-30 LAB — POCT RAPID STREP A (OFFICE): Rapid Strep A Screen: NEGATIVE

## 2014-01-30 MED ORDER — AZITHROMYCIN 250 MG PO TABS: ORAL_TABLET | ORAL | Status: DC

## 2014-01-30 NOTE — Patient Instructions (Signed)

## 2014-01-30 NOTE — Progress Notes (Signed)
Chief Complaint:  Chief Complaint  Patient presents with  . Neck Pain    x3 days   . Back Pain    x3 days  . Otalgia    since friday   . Nasal Congestion  . Fatigue  . Spots and/or Floaters    x1 week     HPI: Autumn Dixon is a 38 y.o. female who is here for  3-4 week history of generalized fatigue thought it was a viral illness, has had bilateral back pain, she has had ear pain and also has had some nasal congestion, no teeth pain, no fevers, some chills, denies dysuria.. She has had some squigly line but no vision cahnges. No confusion, NO SOB, No CP. NO wheezing. 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 No h.o allergies or sinus issues.   Past Medical History  Diagnosis Date  . Anxiety   . Depression    History reviewed. No pertinent past surgical history. History   Social History  . Marital Status: Married    Spouse Name: N/A    Number of Children: N/A  . Years of Education: N/A   Social History Main Topics  . Smoking status: Never Smoker   . Smokeless tobacco: None  . Alcohol Use: None  . Drug Use: None  . Sexual Activity: None   Other Topics Concern  . None   Social History Narrative  . None   History reviewed. No pertinent family history. No Known Allergies Prior to Admission medications   Medication Sig Start Date End Date Taking? Authorizing Provider  alprazolam Prudy Feeler) 2 MG tablet 1/2 tab in am and 1 tab at hs 12/24/13  Yes Elvina Sidle, MD  hydrocortisone (ANUSOL-HC) 2.5 % rectal cream Place rectally 2 (two) times daily. 02/27/13  Yes Elvina Sidle, MD  hydrocortisone (ANUSOL-HC) 25 MG suppository Place 1 suppository (25 mg total) rectally 2 (two) times daily. 03/06/13  Yes Chelle Tessa Lerner, PA-C  levonorgestrel (MIRENA) 20 MCG/24HR IUD 1 each by Intrauterine route once.   Yes Historical Provider, MD  sertraline (ZOLOFT) 100 MG tablet Take 1 tablet (100 mg total) by mouth daily. Needs office visit  for additional refills 12/24/13  Yes Elvina Sidle, MD     ROS: The patient denies night sweats, unintentional weight loss, chest pain, palpitations, wheezing, dyspnea on exertion, nausea, vomiting, abdominal pain, dysuria, hematuria, melena, numbness,  or tingling.   All other systems have been reviewed and were otherwise negative with the exception of those mentioned in the HPI and as above.    PHYSICAL EXAM: Filed Vitals:   01/30/14 1037  BP: 102/68  Pulse: 69  Temp: 98.2 F (36.8 C)  Resp: 18   Filed Vitals:   01/30/14 1037  Height:  (1.753 m)  Weight: 193 lb (87.544 kg)   Body mass index is 28.49 kg/(m^2).  General: Alert, no acute distress HEENT:  Normocephalic, atraumatic, oropharynx patent. EOMI, PERRLA, tm nl , + sinus tenderness right side, + vesicles in OP.  Cardiovascular:  Regular rate and rhythm, no rubs murmurs or gallops.  No Carotid bruits, radial pulse intact. No pedal edema.  Respiratory: Clear to auscultation bilaterally.  No wheezes, rales, or rhonchi.  No cyanosis, no use of accessory musculature GI: No organomegaly, abdomen is soft and non-tender, positive bowel sounds.  No masses. Skin: No rashes. Neurologic: Facial musculature symmetric. Psychiatric: Patient is appropriate throughout our interaction. Lymphatic: + anterior right  sided posterior and  occiptal lymphadenopathy Musculoskeletal: Gait intact.   LABS: Results for orders placed in visit on 01/30/14  POCT URINALYSIS DIPSTICK      Result Value Ref Range   Color, UA yellow     Clarity, UA clear     Glucose, UA neg     Bilirubin, UA neg     Ketones, UA neg     Spec Grav, UA <=1.005     Blood, UA neg     pH, UA 7.0     Protein, UA neg     Urobilinogen, UA 0.2     Nitrite, UA neg     Leukocytes, UA Negative    POCT UA - MICROSCOPIC ONLY      Result Value Ref Range   WBC, Ur, HPF, POC 2-4     RBC, urine, microscopic 0-2     Bacteria, U Microscopic small     Mucus, UA neg      Epithelial cells, urine per micros 2-7     Crystals, Ur, HPF, POC neg     Casts, Ur, LPF, POC neg     Yeast, UA neg    POCT CBC      Result Value Ref Range   WBC 7.3  4.6 - 10.2 K/uL   Lymph, poc 2.1  0.6 - 3.4   POC LYMPH PERCENT 28.3  10 - 50 %L   MID (cbc) 0.3  0 - 0.9   POC MID % 4.0  0 - 12 %M   POC Granulocyte 4.9  2 - 6.9   Granulocyte percent 67.7  37 - 80 %G   RBC 4.36  4.04 - 5.48 M/uL   Hemoglobin 13.6  12.2 - 16.2 g/dL   HCT, POC 62.1  30.8 - 47.9 %   MCV 94.2  80 - 97 fL   MCH, POC 31.2  27 - 31.2 pg   MCHC 33.1  31.8 - 35.4 g/dL   RDW, POC 65.7     Platelet Count, POC 314  142 - 424 K/uL   MPV 7.7  0 - 99.8 fL  POCT RAPID STREP A (OFFICE)      Result Value Ref Range   Rapid Strep A Screen Negative  Negative     EKG/XRAY:   Primary read interpreted by Dr. Conley Rolls at [facility].   ASSESSMENT/PLAN: Encounter Diagnoses  Name Primary?  . Other fatigue Yes  . Low back pain without sciatica, unspecified back pain laterality   . LAD (lymphadenopathy), cervical   . Sore throat   . Acute ethmoidal sinusitis, recurrence not specified      Viral vs bacterial, unlikey mono but will check, EBV labs pending  She is  The Theme park manager at [employer] She does have LAD so will treat with abx and see if resolves.  Daughter at [employer], visiting her this weekend and wants to feel better  F/u with optometrist if sxs do not resolve after treatment of sinusitis for "squigly line in vision" F/u prn   Gross sideeffects, risk and benefits, and alternatives of medications d/w patient. Patient is aware that all medications have potential sideeffects and we are unable to predict every sideeffect or drug-drug interaction that may occur.  Hamilton Capri PHUONG, DO 01/30/2014 12:13 PM

## 2014-01-31 LAB — EPSTEIN-BARR VIRUS VCA ANTIBODY PANEL
EBV EA IgG: <5 U/mL (ref <9.0)
EBV NA IgG: 215 U/mL — ABNORMAL HIGH (ref <18.0)
EBV VCA IgG: 480 U/mL — ABNORMAL HIGH (ref <18.0)
EBV VCA IgM: <10 U/mL (ref <36.0)

## 2014-02-01 LAB — CULTURE, GROUP A STREP: Organism ID, Bacteria: NORMAL

## 2014-07-02 ENCOUNTER — Other Ambulatory Visit: Payer: Self-pay | Admitting: Family Medicine

## 2014-07-06 ENCOUNTER — Telehealth: Payer: Self-pay

## 2014-07-06 NOTE — Telephone Encounter (Signed)
Patient is calling to check on the refill request that [facility] sent over on the 23rd.   Best#: [phone]

## 2014-07-08 NOTE — Telephone Encounter (Signed)
Refused      Disp Refills Start End    hydrocortisone (ANUSOL-HC) 25 MG suppository [Pharmacy Med Name: HYDROCORTISONE AC 25 MG SUPP] 12 suppository 0 07/03/2014     Sig:  PLACE 1 SUPPOSITORY (25 MG TOTAL) RECTALLY 2 (TWO) TIMES DAILY.    Class:  Normal    DAW:  No    Reason for Refusal:  Patient needs an appointment    Refused By:  Bernerd PhoJulie Marie Greer, LPN      It was denied.

## 2014-07-09 ENCOUNTER — Other Ambulatory Visit: Payer: Self-pay | Admitting: Family Medicine

## 2014-07-09 MED ORDER — HYDROCORTISONE ACETATE 25 MG RE SUPP: 25.0000 mg | Freq: Two times a day (BID) | RECTAL | Status: DC

## 2014-07-09 NOTE — Telephone Encounter (Signed)
Dr. Elbert EwingsL,  Pt states you would refill for her. Rx's pended.

## 2014-07-11 ENCOUNTER — Other Ambulatory Visit: Payer: Self-pay | Admitting: Family Medicine

## 2014-07-16 ENCOUNTER — Telehealth: Payer: Self-pay

## 2014-07-16 DIAGNOSIS — K648 Other hemorrhoids: Secondary | ICD-10-CM

## 2014-07-16 NOTE — Telephone Encounter (Signed)
Patient is requesting a referral to [facility] for Hemroids. Patient called to set up her own apppintment and the first avaliable appointment was 09/03/14. Per patient Autumn Dixon told her if our office puts in a referral they would get her in sooner. Patients call back number is [phone]

## 2014-07-18 ENCOUNTER — Encounter: Payer: Self-pay | Admitting: Physician Assistant

## 2014-07-18 NOTE — Telephone Encounter (Signed)
Appointment has been made and patient is aware.

## 2014-07-24 ENCOUNTER — Ambulatory Visit (INDEPENDENT_AMBULATORY_CARE_PROVIDER_SITE_OTHER): Payer: BC Managed Care – PPO | Admitting: Physician Assistant

## 2014-07-24 ENCOUNTER — Encounter: Payer: Self-pay | Admitting: Physician Assistant

## 2014-07-24 VITALS — BP 100/64 | HR 116 | Ht 69.5 in | Wt 165.4 lb

## 2014-07-24 DIAGNOSIS — K602 Anal fissure, unspecified: Secondary | ICD-10-CM

## 2014-07-24 DIAGNOSIS — K648 Other hemorrhoids: Secondary | ICD-10-CM

## 2014-07-24 MED ORDER — DILTIAZEM GEL 2 %: CUTANEOUS | Status: DC

## 2014-07-24 MED ORDER — HYDROCODONE-ACETAMINOPHEN 5-325MG PREPACK (~~LOC~~: ORAL_TABLET | ORAL | Status: DC

## 2014-07-24 MED ORDER — LIDOCAINE (ANORECTAL) 5 % EX CREA: 1.0000 | TOPICAL_CREAM | Freq: Two times a day (BID) | CUTANEOUS | Status: DC | PRN

## 2014-07-24 NOTE — Patient Instructions (Addendum)
We have sent medications to [facility] for you to pick up at your convenience [address], [place], [place] [address]  [phone]  Please review pamphlet regarding anal fissure  Please use tucks wipes as needed  Please follow up in 3 weeks

## 2014-07-24 NOTE — Progress Notes (Addendum)
Patient ID: Autumn MorrowMary A Erekson, female   DOB: Jan 10, 1976, 39 y.o.   MRN: [id number]    HPI:  Autumn Dixon is a 39 y.o.   female referred by Elvina SidleLauenstein, Kurt, MD for evaluation of rectal pain. There he states she has had hemorrhoids since she was pregnant approximately 17 years ago. Through the years her hemorrhoids has periodically been bothersome, but over the past 2 years they have been much more prominent. The past several months she has had severe rectal pain that makes it difficult for her to sit she has even had to leave work because the pain is so severe. It will seem to get better and then a few weeks later will come back. She has a bowel movement every other day and tries to hold her bowel movements because it is so painful to have her bowel movements. She has no abdominal pain. She occasionally has had some blood on the toilet tissue with bowel movements. Her stools have not been hard.   Past Medical History  Diagnosis Date  . Anxiety   . Depression   . Fibromyalgia     Past Surgical History  Procedure Laterality Date  . Knee arthroscopy Right   . Shoulder arthroscopy Right   . Ganglion cyst excision Right     wrist   Family History  Problem Relation Age of Onset  . Colon polyps Mother   . Ovarian cysts Mother   . Pancreatic cancer Maternal Grandfather   . Diabetes Maternal Grandfather   . Diabetes Maternal Grandmother   . Colon polyps Maternal Grandmother   . Heart disease Father 1827  . Colon cancer Paternal Grandfather   . Heart disease Paternal Grandfather   . Diabetes Brother   . Ovarian cysts Sister   . Colitis Maternal Grandmother     ?  Marland Kitchen. Irritable bowel syndrome Maternal Grandmother    History  Substance Use Topics  . Smoking status: Former Smoker -- 1 years    Types: Cigarettes    Quit date: 05/10/1997  . Smokeless tobacco: Never Used  . Alcohol Use: 0.0 oz/week    0 Standard drinks or equivalent per week     Comment: occasional   Current Outpatient  Prescriptions  Medication Sig Dispense Refill  . alprazolam (XANAX) 2 MG tablet 1/2 tab in am and 1 tab at hs (Patient taking differently: Take 1 tablet by  Mouth at bedtime) 180 tablet 5  . hydrocortisone (ANUSOL-HC) 25 MG suppository Place 1 suppository (25 mg total) rectally 2 (two) times daily. 12 suppository 1  . levonorgestrel (MIRENA) 20 MCG/24HR IUD 1 each by Intrauterine route once.    . phentermine 37.5 MG capsule Take 37.5 mg by mouth every morning.    . diltiazem 2 % GEL Apply rectally twice daily for 8 weeks 30 g 1  . HYDROcodone-acetaminophen (VICODIN) 5-325 mg TABS tablet Take 1 tab by mouth daily at bedtime as needed for pain 12 tablet 0  . Lidocaine, Anorectal, (RECTICARE) 5 % CREA Apply 1 Tube topically 2 (two) times daily as needed. 1 Tube 1   No current facility-administered medications for this visit.   No Known Allergies   Review of Systems: Gen: Denies any fever, chills, sweats, anorexia, fatigue, weakness, malaise, weight loss, and sleep disorder CV: Denies chest pain, angina, palpitations, syncope, orthopnea, PND, peripheral edema, and claudication. Resp: Denies dyspnea at rest, dyspnea with exercise, cough, sputum, wheezing, coughing up blood, and pleurisy. GI: Denies vomiting blood, jaundice, and fecal incontinence.  Denies dysphagia or odynophagia. GU : Denies urinary burning, blood in urine, urinary frequency, urinary hesitancy, nocturnal urination, and urinary incontinence. MS: Denies joint pain, limitation of movement, and swelling, stiffness, low back pain, extremity pain. Denies muscle weakness, cramps, atrophy.  Derm: Denies rash, itching, dry skin, hives, moles, warts, or unhealing ulcers.  Psych: Denies depression, anxiety, memory loss, suicidal ideation, hallucinations, paranoia, and confusion. Heme: Denies bruising, bleeding, and enlarged lymph nodes. Neuro:  Denies any headaches, dizziness, paresthesias. Endo:  Denies any problems with DM, thyroid,  adrenal function    Physical Exam: BP 100/64 mmHg  Pulse 116  Ht 5' 9.5" (1.765 m)  Wt 165 lb 6 oz (75.014 kg)  BMI 24.08 kg/m2 Constitutional: Pleasant,well-developedfemale in no acute distress. HEENT: Normocephalic and atraumatic. Conjunctivae are normal. No scleral icterus. Neck supple.  Cardiovascular: Normal rate, regular rhythm.  Pulmonary/chest: Effort normal and breath sounds normal. No wheezing, rales or rhonchi. Abdominal: Soft, nondistended, nontender. Bowel sounds active throughout. There are no masses palpable. No hepatomegaly. Rectal: Digital rectal exam painful for patient. Recticare cream applied to perianal area and anus. Anoscopy revealed internal hemorrhoids and a posterior fissure. Nitroglycerin ointment was applied to the fissure area. Extremities: no edema Lymphadenopathy: No cervical adenopathy noted. Neurological: Alert and oriented to person place and time. Skin: Skin is warm and dry. No rashes noted. Psychiatric: Normal mood and affect. Behavior is normal.  ASSESSMENT AND PLAN:  39 year old female with a several month history of severe rectal pain amongst a long-standing history of internal hemorrhoids referred for evaluation. She will use recticare cream 5% she has to apply this rectally as needed to the anus. She will use diltiazem ointment 2%. She has to apply this rectally twice daily for 8 weeks. She has been given a prescription for Vicodin 5/325 one by mouth daily at bedtime to use only if she is having severe pain. #12 with no refill was given. She will use Tucks wipes. She has been instructed to use a stool softener. She will follow up in 3 weeks, sooner if needed. She may be a candidate for hemorrhoidal banding.   Hanley Rispoli, Tollie Pizza PA-C 07/24/2014, 1:08 PM  CC: Elvina Sidle, MD  Addendum: Reviewed and agree with initial management. Beverley Fiedler, MD

## 2014-07-26 ENCOUNTER — Ambulatory Visit: Payer: BC Managed Care – PPO | Admitting: Physician Assistant

## 2014-07-30 ENCOUNTER — Ambulatory Visit (INDEPENDENT_AMBULATORY_CARE_PROVIDER_SITE_OTHER): Payer: BC Managed Care – PPO | Admitting: Internal Medicine

## 2014-07-30 ENCOUNTER — Encounter: Payer: Self-pay | Admitting: Internal Medicine

## 2014-07-30 ENCOUNTER — Telehealth: Payer: Self-pay | Admitting: Physician Assistant

## 2014-07-30 VITALS — BP 104/58 | HR 76 | Ht 69.5 in | Wt 166.4 lb

## 2014-07-30 DIAGNOSIS — K629 Disease of anus and rectum, unspecified: Secondary | ICD-10-CM

## 2014-07-30 DIAGNOSIS — K602 Anal fissure, unspecified: Secondary | ICD-10-CM

## 2014-07-30 MED ORDER — AMBULATORY NON FORMULARY MEDICATION: Status: DC

## 2014-07-30 MED ORDER — LIDOCAINE (ANORECTAL) 5 % EX CREA: TOPICAL_CREAM | CUTANEOUS | Status: DC

## 2014-07-30 MED ORDER — HYDROCODONE-ACETAMINOPHEN 5-325MG PREPACK (~~LOC~~: ORAL_TABLET | ORAL | Status: DC

## 2014-07-30 NOTE — Patient Instructions (Signed)
We have sent the following medications to your pharmacy for you to pick up at your convenience: Recticare  We have given you a prescription for vicodin and nitroglycerin to take to your pharmacy.  Please continue stool softeners.  Please refrain from taking excess tylenol.  You have been scheduled for an appointment with Dr Romie LeveeAlicia Thomas at [facility]. Your appointment is on 08/27/14 at 10:50 am. Make certain to bring a list of current medications, including any over the counter medications or vitamins. Also bring your co-pay if you have one as well as your insurance cards. [facility] is located at [address], [address]. Should you need to reschedule your appointment, please contact them at [phone].

## 2014-07-30 NOTE — Progress Notes (Signed)
Subjective:    Patient ID: Autumn Dixon, female    DOB: Jul 30, 1975, 39 y.o.   MRN: [id number]  HPI Autumn Dixon is a 39 year old female who is seen in follow-up for anal fissure for ongoing severe anal pain. She was seen by Autumn Filler, PA-C on 07/24/2014 to evaluate severe anal pain. Rectal exam on that day was very painful and anoscopy revealed internal hemorrhoids and posterior fissure. She was given a prescription for topical lidocaine and topical diltiazem gel.  She returns because she continues to have excruciating anal pain. This is nearly constant and much worse after having a bowel movement. She reports it worsened after anoscopy and rectal exam last week. It hurts for her to sit down. She's tried sitz baths. She has limited her diet because she fears having a bowel movement due to the intense pain. She is also seen more bleeding since rectal exam last week. Leading is bright red. No abdominal pain. Pain can be throbbing and sharp like a razor. She states it is much worse than pain associated with childbirth. It has caused her to miss work including today. Lidocaine seems to help a little bit but burns intensely for a few seconds. She's use Vicodin initially 1 tablet helped none but she used 2 tablets at bedtime last night and was able to sleep better. She lies on her stomach because lying supine causes tenderness. No fevers or chills  Review of Systems As per history of present illness, otherwise negative  Current Medications, Allergies, Past Medical History, Past Surgical History, Family History and Social History were reviewed in [employer] record.     Objective:   Physical Exam BP 104/58 mmHg  Pulse 76  Ht 5' 9.5" (1.765 m)  Wt 166 lb 6.4 oz (75.479 kg)  BMI 24.23 kg/m2 Constitutional: Well-developed and well-nourished. No distress. HEENT: Normocephalic and atraumatic.  No scleral icterus. Neck: Neck supple. Trachea midline. Cardiovascular: Normal  rate, regular rhythm and intact distal pulses.  Pulmonary/chest: Effort normal and breath sounds normal. No wheezing, rales or rhonchi. Abdominal: Soft, nontender, nondistended. Bowel sounds active throughout.  Rectal: External exam only. No perianal tenderness fluctuation or induration. When the external anal tissue is spread laterally there is intense pain in the very top part of the posterior fissure was visible. No perirectal abscess palpable or visible Extremities: no clubbing, cyanosis, or edema Lymphadenopathy: No cervical adenopathy noted. Neurological: Alert and oriented to person place and time. Skin: Skin is warm and dry. No rashes noted. Psychiatric: Normal mood and affect. Behavior is normal.     Assessment & Plan:   39 year old female who is seen in follow-up for anal fissure for ongoing severe anal pain.   1. Anal fissure -- symptoms have been quite debilitating for her and severe. She has not benefited from diltiazem gel. I expect the fissure was opened further with anoscopic and rectal exam last week. She has been fearing bowel movement which also is not good because likely will lead to only larger bowel movements and further risk exacerbating fissure. We discussed overall treatment and I will switch her to topical nitroglycerin ointment 0.2% to be applied 3 times daily. Topical lidocaine as needed though this may not provide much benefit. Continue Colace 300 mg daily. Additional Vicodin prescription given today she can use 1-2 tablets every 8 hours as needed. I specifically let her know that this medication may cause constipation which may be detrimental to the fissure. Hopefully with topical nitroglycerin this will  heal more quickly and she will no longer need pain medication  --Finally if medical therapy fails she may need intersphincteric Botox injection or even surgery. I am referring her to Dr. Romie LeveeAlicia Dixon for consideration of Botox therapy in the event of failure of  nitroglycerin glycerin  --I asked that she call if there is no significant improvement in the next week

## 2014-07-30 NOTE — Telephone Encounter (Signed)
Spoke with patient and she states she is in more pain this week than when she saw Tenneco IncLori Hvozdovic, PA-C. States the anal fissure is bleeding a lot. States she is holding bowel movements because of the terrible pain. She is using Diltiazem gel, Recticare and Vicodin at night. States she took 2 of the Vicodin to sleep last night. She is out of work today because of pain. She is in pain sitting or standing. Patient is tearful. Spoke with Autumn FiscalLori Hvozdovic, PA-C and Autumn Dixon. Spoke with patient and explained it takes many weeks for anal fissure to heal. If it is very tender, may not be much to do except change medications.(Nitroglycerin cream). Patient prefers to see MD. Scheduled with Autumn Dixon today at 3:00 PM.

## 2014-07-31 ENCOUNTER — Telehealth: Payer: Self-pay | Admitting: *Deleted

## 2014-07-31 NOTE — Telephone Encounter (Signed)
I have spoken to patient to advise that Dr Maisie Fushomas is hospital doctor next week but if the nitroglycerin given by us does not give her some relief, we would like to know as Dr Maisie Fushomas may be able to get her in to be seen in the next couple weeks. Patient verbalizes understanding of this.  ----- Message -----    From: Beverley FiedlerJay M Pyrtle, MD    Sent: 07/31/2014  11:43 AM      To: Richardson Chiquitoorothy N Shaquasha Gerstel, CMA  Let pt know I reached out to Dr. Maisie Fushomas and if not getting better she can work her in.  See below. Thanks  ----- Message -----    From: Romie LeveeAlicia Thomas, MD    Sent: 07/31/2014  11:02 AM      To: Beverley FiedlerJay M Pyrtle, MD  I'll see what I can do.  I'm on call in the hospital all next week, but maybe we can get her in the following week.     Helmut MusterAlicia ----- Message -----    From: Beverley FiedlerJay M Pyrtle, MD    Sent: 07/30/2014   4:55 PM      To: Romie LeveeAlicia Thomas, MD  Helmut MusterAlicia, Very nice lady with severe anal fissure symptoms. Very severe pain. I have just changed her to topical nitroglycerin She may require Botox. Any chance she could see her fairly soon, if she doesn't improve quickly? Your next appointment is mid to late April according to my nurse and I'm not sure she can take the pain that long. Thanks Vonna KotykJay

## 2014-08-20 ENCOUNTER — Ambulatory Visit: Payer: BC Managed Care – PPO | Admitting: Physician Assistant

## 2015-07-15 ENCOUNTER — Telehealth: Payer: Self-pay

## 2015-07-15 ENCOUNTER — Ambulatory Visit (INDEPENDENT_AMBULATORY_CARE_PROVIDER_SITE_OTHER): Payer: BC Managed Care – PPO | Admitting: Family Medicine

## 2015-07-15 VITALS — BP 118/68 | HR 67 | Temp 98.5°F | Resp 16 | Ht 71.0 in | Wt 181.0 lb

## 2015-07-15 DIAGNOSIS — R6889 Other general symptoms and signs: Secondary | ICD-10-CM

## 2015-07-15 DIAGNOSIS — J069 Acute upper respiratory infection, unspecified: Secondary | ICD-10-CM | POA: Diagnosis not present

## 2015-07-15 DIAGNOSIS — Z0000 Encounter for general adult medical examination without abnormal findings: Secondary | ICD-10-CM | POA: Diagnosis not present

## 2015-07-15 DIAGNOSIS — G4700 Insomnia, unspecified: Secondary | ICD-10-CM | POA: Diagnosis not present

## 2015-07-15 DIAGNOSIS — J3489 Other specified disorders of nose and nasal sinuses: Secondary | ICD-10-CM

## 2015-07-15 LAB — POCT CBC
Granulocyte percent: 38.7 %G (ref 37–80)
HCT, POC: 33.3 % — AB (ref 37.7–47.9)
Hemoglobin: 12.6 g/dL (ref 12.2–16.2)
Lymph, poc: 1.5 (ref 0.6–3.4)
MCH, POC: 33.6 pg — AB (ref 27–31.2)
MCHC: 37.9 g/dL — AB (ref 31.8–35.4)
MCV: 88.7 fL (ref 80–97)
MID (cbc): 0.1 (ref 0–0.9)
MPV: 7.2 fL (ref 0–99.8)
POC Granulocyte: 1 — AB (ref 2–6.9)
POC LYMPH PERCENT: 56.6 %L — AB (ref 10–50)
POC MID %: 4.7 %M (ref 0–12)
Platelet Count, POC: 200 10*3/uL (ref 142–424)
RBC: 3.75 M/uL — AB (ref 4.04–5.48)
RDW, POC: 12.3 %
WBC: 2.7 10*3/uL — AB (ref 4.6–10.2)

## 2015-07-15 LAB — POC MICROSCOPIC URINALYSIS (UMFC): Mucus: ABSENT

## 2015-07-15 LAB — POCT URINALYSIS DIP (MANUAL ENTRY)
Bilirubin, UA: NEGATIVE
Blood, UA: NEGATIVE
Glucose, UA: NEGATIVE
Ketones, POC UA: NEGATIVE
Nitrite, UA: NEGATIVE
Protein Ur, POC: NEGATIVE
Spec Grav, UA: <=1.005
Urobilinogen, UA: 0.2
pH, UA: 6

## 2015-07-15 LAB — TSH: TSH: 2.4 mIU/L

## 2015-07-15 LAB — POCT SEDIMENTATION RATE: POCT SED RATE: 8 mm/hr (ref 0–22)

## 2015-07-15 LAB — VITAMIN B12: Vitamin B-12: 357 pg/mL (ref 200–1100)

## 2015-07-15 MED ORDER — SERTRALINE HCL 100 MG PO TABS: 100.0000 mg | ORAL_TABLET | Freq: Every day | ORAL | Status: DC

## 2015-07-15 MED ORDER — ALPRAZOLAM 2 MG PO TABS: ORAL_TABLET | ORAL | Status: DC

## 2015-07-15 MED ORDER — MUPIROCIN 2 % EX OINT: 1.0000 "application " | TOPICAL_OINTMENT | Freq: Two times a day (BID) | CUTANEOUS | Status: DC

## 2015-07-15 NOTE — Progress Notes (Signed)
Patient ID: Autumn MorrowMary A Dixon, female   DOB: [birth year], 40 y.o.   MRN: [id number]  By signing my name below, I, Essence Howell, attest that this documentation has been prepared under the direction and in the presence of Elvina SidleKurt Dragon Thrush, MD Electronically Signed: Charline BillsEssence Howell, ED Scribe 07/15/2015 at 11:00 AM.  Patient ID: Autumn MorrowMary A Dixon MRN: [id number], DOB: [birth year], 40 y.o. Date of Encounter: 07/15/2015, 10:37 AM  Primary Physician: Elvina SidleLAUENSTEIN,Caitriona Sundquist, MD  Chief Complaint:  Chief Complaint  Patient presents with  . Annual Exam    physical exam no pap  . Generalized Body Aches    x 1 week   . Fever    x 2 days   . Cough    x 4 days   . Medication Refill    Zoloft, Alprazolam 2 mg    HPI: 40 y.o. year old female with history below presents for generalized body aches for the past week. Pt reports 1 episode of emesis 3 days ago, nausea 4 days ago that has resolved, diarrhea that has resolved, loss of appetite, HA for the past week, dry cough for the past 2 days, abrasion in left nostril for 1-2 weeks, fever with Tmax of 101 F 4 days ago that has resolved. She has tried Mucinex with mild relief. Pt has not had her influenza vaccine. Pt's last tdap was 2010.   Pt states that her father passed at age 38 of heart disease.  Annual exam  Pt reports gradually worsening coldness, specifically in her hands and feet. Pt states that she stays cold all the time. She further reports using a heated blanket often when everyone else is warm. Pt reports associated tingling sensation in her feet and cramps in her feet x2 weekly that last for approximately 20 minutes.   Preventative Maintenance  Pt has not had a mammogram. Her last pap smear was 1.5 years ago; recently scheduled an appointment. No h/o abnormal pap smears.   Medication Refill Pt requests a refill of Zoloft and Xanax at this visit.   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 She is currently taking classes at [employer] towards her social work  degree.  Past Medical History  Diagnosis Date  . Anxiety   . Depression   . Fibromyalgia     Home Meds: Prior to Admission medications   Medication Sig Start Date End Date Taking? Authorizing Provider  alprazolam Prudy Feeler(XANAX) 2 MG tablet 1/2 tab in am and 1 tab at hs Patient taking differently: Take 1 tablet by  Mouth at bedtime 12/24/13  Yes Elvina SidleKurt Lucius Wise, MD  sertraline (ZOLOFT) 100 MG tablet Take 100 mg by mouth daily.   Yes Historical Provider, MD    Allergies: No Known Allergies  Social History   Social History  . Marital Status: Married    Spouse Name: N/A  . Number of Children: 4  . Years of Education: N/A   Occupational History  . secretary/school treasurer    Social History Main Topics  . Smoking status: Former Smoker -- 1 years    Types: Cigarettes    Quit date: 05/10/1997  . Smokeless tobacco: Never Used  . Alcohol Use: 0.0 oz/week    0 Standard drinks or equivalent per week     Comment: occasional  . Drug Use: No  . Sexual Activity: Not on file   Other Topics Concern  . Not on file   Social History Narrative    Review of Systems: Constitutional: negative for chills, night sweats,  weight changes, or fatigue, +fever, +appetite change HEENT: negative for vision changes, hearing loss, congestion, rhinorrhea, ST, epistaxis, or sinus pressure Cardiovascular: negative for chest pain or palpitations Respiratory: negative for hemoptysis, wheezing, shortness of breath, +cough Abdominal: negative for abdominal pain or constipation, +vomiting, +diarrhea, +nausea Msk: +myalgias Dermatological: negative for rash Neurologic: negative for dizziness, or syncope, +headache  All other systems reviewed and are otherwise negative with the exception to those above and in the HPI.  Physical Exam: Blood pressure 118/68, pulse 67, temperature 98.5 F (36.9 C), temperature source Oral, resp. rate 16, height  (1.803 m), weight 181 lb (82.101 kg), SpO2 99 %., Body mass  index is 25.26 kg/(m^2). General: Well developed, well nourished, in no acute distress. Head: Abrasion on L septum. Normocephalic, atraumatic, eyes without discharge, sclera non-icteric, nares are without discharge. Bilateral auditory canals clear, TM's are without perforation, pearly grey and translucent with reflective cone of light bilaterally. Oral cavity moist, posterior pharynx without exudate, erythema, peritonsillar abscess, or post nasal drip.  Neck: Supple. No thyromegaly. Full ROM. No lymphadenopathy. Lungs: Clear bilaterally to auscultation without wheezes, rales, or rhonchi. Breathing is unlabored. Heart: 1/6 early systolic flow murmur. Abdomen: Soft, non-tender, non-distended with normoactive bowel sounds. No hepatomegaly. No rebound/guarding. No obvious abdominal masses. Msk:  Strength and tone normal for age. Extremities/Skin: Warm and dry. No clubbing or cyanosis. No edema. No rashes or suspicious lesions. Neuro: Alert and oriented X 3. Moves all extremities spontaneously. Gait is normal. CNII-XII grossly in tact. Psych:  Responds to questions appropriately with a normal affect.   Labs: Results for orders placed or performed in visit on 07/15/15  POCT CBC  Result Value Ref Range   WBC 2.7 (A) 4.6 - 10.2 K/uL   Lymph, poc 1.5 0.6 - 3.4   POC LYMPH PERCENT 56.6 (A) 10 - 50 %L   MID (cbc) 0.1 0 - 0.9   POC MID % 4.7 0 - 12 %M   POC Granulocyte 1.0 (A) 2 - 6.9   Granulocyte percent 38.7 37 - 80 %G   RBC 3.75 (A) 4.04 - 5.48 M/uL   Hemoglobin 12.6 12.2 - 16.2 g/dL   HCT, POC 16.1 (A) 09.6 - 47.9 %   MCV 88.7 80 - 97 fL   MCH, POC 33.6 (A) 27 - 31.2 pg   MCHC 37.9 (A) 31.8 - 35.4 g/dL   RDW, POC 04.5 %   Platelet Count, POC 200 142 - 424 K/uL   MPV 7.2 0 - 99.8 fL  POCT urinalysis dipstick  Result Value Ref Range   Color, UA yellow yellow   Clarity, UA clear clear   Glucose, UA negative negative   Bilirubin, UA negative negative   Ketones, POC UA negative negative    Spec Grav, UA <=1.005    Blood, UA negative negative   pH, UA 6.0    Protein Ur, POC negative negative   Urobilinogen, UA 0.2    Nitrite, UA Negative Negative   Leukocytes, UA Trace (A) Negative  POCT Microscopic Urinalysis ([facility])  Result Value Ref Range   WBC,UR,HPF,POC Few (A) None WBC/hpf   RBC,UR,HPF,POC None None RBC/hpf   Bacteria None None, Too numerous to count   Mucus Absent Absent   Epithelial Cells, UR Per Microscopy None None, Too numerous to count cells/hpf     ASSESSMENT AND PLAN:  40 y.o. year old female with  1. Annual physical exam   2. Cold feeling   3. Acute upper respiratory infection  Signed, Elvina Sidle, MD 07/15/2015 10:37 AM

## 2015-07-15 NOTE — Addendum Note (Signed)
Addended by: Elvina SidleLAUENSTEIN, Rivaan Kendall on: 07/15/2015 11:53 AM   Modules accepted: Orders, SmartSet

## 2015-07-15 NOTE — Telephone Encounter (Signed)
Ok. Done 

## 2015-07-15 NOTE — Telephone Encounter (Signed)
PT STATES SHE NEEDED PROFILE UPDATE  Pharmacy:  [facility] ON [address]  FOR COMPOUND RX ONLY: [facility] - [place], [place] - [address]Pharmacy:  [facility] [place]

## 2015-07-15 NOTE — Patient Instructions (Signed)
Health Maintenance, Female Adopting a healthy lifestyle and getting preventive care can go a long way to promote health and wellness. Talk with your health care provider about what schedule of regular examinations is right for you. This is a good chance for you to check in with your provider about disease prevention and staying healthy. In between checkups, there are plenty of things you can do on your own. Experts have done a lot of research about which lifestyle changes and preventive measures are most likely to keep you healthy. Ask your health care provider for more information. WEIGHT AND DIET  Eat a healthy diet  Be sure to include plenty of vegetables, fruits, low-fat dairy products, and lean protein.  Do not eat a lot of foods high in solid fats, added sugars, or salt.  Get regular exercise. This is one of the most important things you can do for your health.  Most adults should exercise for at least 150 minutes each week. The exercise should increase your heart rate and make you sweat (moderate-intensity exercise).  Most adults should also do strengthening exercises at least twice a week. This is in addition to the moderate-intensity exercise.  Maintain a healthy weight  Body mass index (BMI) is a measurement that can be used to identify possible weight problems. It estimates body fat based on height and weight. Your health care provider can help determine your BMI and help you achieve or maintain a healthy weight.  For females 20 years of age and older:   A BMI below 18.5 is considered underweight.  A BMI of 18.5 to 24.9 is normal.  A BMI of 25 to 29.9 is considered overweight.  A BMI of 30 and above is considered obese.  Watch levels of cholesterol and blood lipids  You should start having your blood tested for lipids and cholesterol at 38 years of age, then have this test every 5 years.  You may need to have your cholesterol levels checked more often if:  Your lipid  or cholesterol levels are high.  You are older than 38 years of age.  You are at high risk for heart disease.  CANCER SCREENING   Lung Cancer  Lung cancer screening is recommended for adults 55-80 years old who are at high risk for lung cancer because of a history of smoking.  A yearly low-dose CT scan of the lungs is recommended for people who:  Currently smoke.  Have quit within the past 15 years.  Have at least a 30-pack-year history of smoking. A pack year is smoking an average of one pack of cigarettes a day for 1 year.  Yearly screening should continue until it has been 15 years since you quit.  Yearly screening should stop if you develop a health problem that would prevent you from having lung cancer treatment.  Breast Cancer  Practice breast self-awareness. This means understanding how your breasts normally appear and feel.  It also means doing regular breast self-exams. Let your health care provider know about any changes, no matter how small.  If you are in your 20s or 30s, you should have a clinical breast exam (CBE) by a health care provider every 1-3 years as part of a regular health exam.  If you are 40 or older, have a CBE every year. Also consider having a breast X-ray (mammogram) every year.  If you have a family history of breast cancer, talk to your health care provider about genetic screening.  If you   are at high risk for breast cancer, talk to your health care provider about having an MRI and a mammogram every year.  Breast cancer gene (BRCA) assessment is recommended for women who have family members with BRCA-related cancers. BRCA-related cancers include:  Breast.  Ovarian.  Tubal.  Peritoneal cancers.  Results of the assessment will determine the need for genetic counseling and BRCA1 and BRCA2 testing. Cervical Cancer Your health care provider may recommend that you be screened regularly for cancer of the pelvic organs (ovaries, uterus, and  vagina). This screening involves a pelvic examination, including checking for microscopic changes to the surface of your cervix (Pap test). You may be encouraged to have this screening done every 3 years, beginning at age 21.  For women ages 30-65, health care providers may recommend pelvic exams and Pap testing every 3 years, or they may recommend the Pap and pelvic exam, combined with testing for human papilloma virus (HPV), every 5 years. Some types of HPV increase your risk of cervical cancer. Testing for HPV may also be done on women of any age with unclear Pap test results.  Other health care providers may not recommend any screening for nonpregnant women who are considered low risk for pelvic cancer and who do not have symptoms. Ask your health care provider if a screening pelvic exam is right for you.  If you have had past treatment for cervical cancer or a condition that could lead to cancer, you need Pap tests and screening for cancer for at least 20 years after your treatment. If Pap tests have been discontinued, your risk factors (such as having a new sexual partner) need to be reassessed to determine if screening should resume. Some women have medical problems that increase the chance of getting cervical cancer. In these cases, your health care provider may recommend more frequent screening and Pap tests. Colorectal Cancer  This type of cancer can be detected and often prevented.  Routine colorectal cancer screening usually begins at 38 years of age and continues through 38 years of age.  Your health care provider may recommend screening at an earlier age if you have risk factors for colon cancer.  Your health care provider may also recommend using home test kits to check for hidden blood in the stool.  A small camera at the end of a tube can be used to examine your colon directly (sigmoidoscopy or colonoscopy). This is done to check for the earliest forms of colorectal  cancer.  Routine screening usually begins at age 50.  Direct examination of the colon should be repeated every 5-10 years through 38 years of age. However, you may need to be screened more often if early forms of precancerous polyps or small growths are found. Skin Cancer  Check your skin from head to toe regularly.  Tell your health care provider about any new moles or changes in moles, especially if there is a change in a mole's shape or color.  Also tell your health care provider if you have a mole that is larger than the size of a pencil eraser.  Always use sunscreen. Apply sunscreen liberally and repeatedly throughout the day.  Protect yourself by wearing long sleeves, pants, a wide-brimmed hat, and sunglasses whenever you are outside. HEART DISEASE, DIABETES, AND HIGH BLOOD PRESSURE   High blood pressure causes heart disease and increases the risk of stroke. High blood pressure is more likely to develop in:  People who have blood pressure in the high end   of the normal range (130-139/85-89 mm Hg).  People who are overweight or obese.  People who are African American.  If you are 38-23 years of age, have your blood pressure checked every 3-5 years. If you are 61 years of age or older, have your blood pressure checked every year. You should have your blood pressure measured twice--once when you are at a hospital or clinic, and once when you are not at a hospital or clinic. Record the average of the two measurements. To check your blood pressure when you are not at a hospital or clinic, you can use:  An automated blood pressure machine at a pharmacy.  A home blood pressure monitor.  If you are between 45 years and 39 years old, ask your health care provider if you should take aspirin to prevent strokes.  Have regular diabetes screenings. This involves taking a blood sample to check your fasting blood sugar level.  If you are at a normal weight and have a low risk for diabetes,  have this test once every three years after 38 years of age.  If you are overweight and have a high risk for diabetes, consider being tested at a younger age or more often. PREVENTING INFECTION  Hepatitis B  If you have a higher risk for hepatitis B, you should be screened for this virus. You are considered at high risk for hepatitis B if:  You were born in a country where hepatitis B is common. Ask your health care provider which countries are considered high risk.  Your parents were born in a high-risk country, and you have not been immunized against hepatitis B (hepatitis B vaccine).  You have HIV or AIDS.  You use needles to inject street drugs.  You live with someone who has hepatitis B.  You have had sex with someone who has hepatitis B.  You get hemodialysis treatment.  You take certain medicines for conditions, including cancer, organ transplantation, and autoimmune conditions. Hepatitis C  Blood testing is recommended for:  Everyone born from [birth year] through 1963.  Anyone with known risk factors for hepatitis C. Sexually transmitted infections (STIs)  You should be screened for sexually transmitted infections (STIs) including gonorrhea and chlamydia if:  You are sexually active and are younger than 38 years of age.  You are older than 38 years of age and your health care provider tells you that you are at risk for this type of infection.  Your sexual activity has changed since you were last screened and you are at an increased risk for chlamydia or gonorrhea. Ask your health care provider if you are at risk.  If you do not have HIV, but are at risk, it may be recommended that you take a prescription medicine daily to prevent HIV infection. This is called pre-exposure prophylaxis (PrEP). You are considered at risk if:  You are sexually active and do not regularly use condoms or know the HIV status of your partner(s).  You take drugs by injection.  You are sexually  active with a partner who has HIV. Talk with your health care provider about whether you are at high risk of being infected with HIV. If you choose to begin PrEP, you should first be tested for HIV. You should then be tested every 3 months for as long as you are taking PrEP.  PREGNANCY   If you are premenopausal and you may become pregnant, ask your health care provider about preconception counseling.  If you may  become pregnant, take 400 to 800 micrograms (mcg) of folic acid every day.  If you want to prevent pregnancy, talk to your health care provider about birth control (contraception). OSTEOPOROSIS AND MENOPAUSE   Osteoporosis is a disease in which the bones lose minerals and strength with aging. This can result in serious bone fractures. Your risk for osteoporosis can be identified using a bone density scan.  If you are 61 years of age or older, or if you are at risk for osteoporosis and fractures, ask your health care provider if you should be screened.  Ask your health care provider whether you should take a calcium or vitamin D supplement to lower your risk for osteoporosis.  Menopause may have certain physical symptoms and risks.  Hormone replacement therapy may reduce some of these symptoms and risks. Talk to your health care provider about whether hormone replacement therapy is right for you.  HOME CARE INSTRUCTIONS   Schedule regular health, dental, and eye exams.  Stay current with your immunizations.   Do not use any tobacco products including cigarettes, chewing tobacco, or electronic cigarettes.  If you are pregnant, do not drink alcohol.  If you are breastfeeding, limit how much and how often you drink alcohol.  Limit alcohol intake to no more than 1 drink per day for nonpregnant women. One drink equals 12 ounces of beer, 5 ounces of wine, or 1 ounces of hard liquor.  Do not use street drugs.  Do not share needles.  Ask your health care provider for help if  you need support or information about quitting drugs.  Tell your health care provider if you often feel depressed.  Tell your health care provider if you have ever been abused or do not feel safe at home.   This information is not intended to replace advice given to you by your health care provider. Make sure you discuss any questions you have with your health care provider.   Document Released: 11/09/2010 Document Revised: 05/17/2014 Document Reviewed: 03/28/2013 Elsevier Interactive Patient Education [employer].

## 2015-07-16 LAB — COMPLETE METABOLIC PANEL WITH GFR
ALT: 15 U/L (ref 6–29)
AST: 16 U/L (ref 10–30)
Albumin: 3.8 g/dL (ref 3.6–5.1)
Alkaline Phosphatase: 52 U/L (ref 33–115)
BUN: 6 mg/dL — ABNORMAL LOW (ref 7–25)
CO2: 26 mmol/L (ref 20–31)
Calcium: 8.7 mg/dL (ref 8.6–10.2)
Chloride: 106 mmol/L (ref 98–110)
Creat: 0.59 mg/dL (ref 0.50–1.10)
GFR, Est African American: >89 mL/min (ref >=60)
GFR, Est Non African American: >89 mL/min (ref >=60)
Glucose, Bld: 83 mg/dL (ref 65–99)
Potassium: 4.1 mmol/L (ref 3.5–5.3)
Sodium: 139 mmol/L (ref 135–146)
Total Bilirubin: 0.3 mg/dL (ref 0.2–1.2)
Total Protein: 6.1 g/dL (ref 6.1–8.1)

## 2015-07-16 LAB — LIPID PANEL
Cholesterol: 124 mg/dL — ABNORMAL LOW (ref 125–200)
HDL: 64 mg/dL (ref >=46)
LDL Cholesterol: 47 mg/dL (ref <130)
Total CHOL/HDL Ratio: 1.9 Ratio (ref <=5.0)
Triglycerides: 63 mg/dL (ref <150)
VLDL: 13 mg/dL (ref <30)

## 2015-07-17 ENCOUNTER — Encounter: Payer: Self-pay | Admitting: *Deleted

## 2015-12-04 ENCOUNTER — Ambulatory Visit (INDEPENDENT_AMBULATORY_CARE_PROVIDER_SITE_OTHER): Payer: BC Managed Care – PPO | Admitting: Urgent Care

## 2015-12-04 VITALS — BP 118/74 | HR 82 | Temp 98.4°F | Resp 17 | Ht 71.0 in | Wt 184.0 lb

## 2015-12-04 DIAGNOSIS — Z23 Encounter for immunization: Secondary | ICD-10-CM

## 2015-12-04 NOTE — Progress Notes (Signed)
    MRN: [id number] DOB: May 20, 1975  Subjective:   Autumn Dixon is a 40 y.o. female presenting for chief complaint of Immunizations (Tdap and mmr)  Patient is required to update her immunizations for tdap, MMR. She is going to be working at [employer]. I offered patient titers for MMR but she has only had one vaccination before in 1980 as seen in [place]. She would like to have the 2nd dose to present to  [facility] so that she may start working immediately.  Barb has a current medication list which includes the following prescription(s): alprazolam and sertraline. Also has No Known Allergies.  Autumn Dixon  has a past medical history of Anxiety; Depression; and Fibromyalgia. Also  has a past surgical history that includes Knee arthroscopy (Right); Shoulder arthroscopy (Right); and Ganglion cyst excision (Right).  Objective:   Vitals: BP 118/74 (BP Location: Right Arm, Patient Position: Sitting, Cuff Size: Normal)   Pulse 82   Temp 98.4 F (36.9 C) (Oral)   Resp 17   Ht _0  (1.803 m)   Wt 184 lb (83.5 kg)   SpO2 99%   BMI 25.66 kg/m   Physical Exam  Constitutional: She is oriented to person, place, and time. She appears well-developed and well-nourished.  Cardiovascular: Normal rate.   Pulmonary/Chest: Effort normal.  Neurological: She is alert and oriented to person, place, and time.   Assessment and Plan :   1. Encounter for immunization - Immunizations updated, return to clinic as needed.  2. Need for Tdap vaccination - Tdap vaccine greater than or equal to 7yo IM  3. Need for MMR vaccine - MMR vaccine subcutaneous   Jaynee Eagles, PA-C [facility] [phone] 12/04/2015 2:57 PM

## 2015-12-04 NOTE — Patient Instructions (Addendum)
Measles/Mumps/Rubella Vaccines, MMR injection What is this medicine? MEASLES VIRUS; MUMPS VIRUS; RUBELLA VIRUS VACCINE LIVE (MEE zuhlz VAHY ruhs; muhmps VAHY ruhs; roo bel uh VAHY ruhs vak SEEN [place] ) is used to prevent an infection with measles (rubeola), mumps, and rubella ([place] measles) viruses. It is used to prevent infection in children over 23 months old, adults that have not been vaccinated and are not pregnant, and anyone traveling to countries where there are high rates of measles, mumps, or rubella. This medicine may be used for other purposes; ask your health care provider or pharmacist if you have questions. What should I tell my health care provider before I take this medicine? They need to know if you have any of these conditions: -bleeding disorder -cancer including leukemia or lymphoma -immune system problems -infection with fever -low levels of platelets in the blood -recent blood transfusion or immune globulin infusion -seizure disorder -taking medicines for immunosuppression -an unusual or allergic reaction to vaccines, eggs, neomycin, gelatin, other medicines, foods, dyes, or preservatives -pregnant or trying to get pregnant -breast-feeding How should I use this medicine? This vaccine is for injection under the skin. It is given by a health care professional. A copy of Vaccine Information Statements will be given before each vaccination. Read this sheet carefully each time. The sheet may change frequently. Talk to your pediatrician regarding the use of this medicine in children. While this drug may be prescribed for children as young as 20 months of age for selected conditions, precautions do apply. Overdosage: If you think you have taken too much of this medicine contact a poison control center or emergency room at once. NOTE: This medicine is only for you. Do not share this medicine with others. What if I miss a dose? Keep appointments for follow-up (booster) doses as  directed. It is important not to miss your dose. Call your doctor or health care professional if you are unable to keep an appointment. What may interact with this medicine? Do not take this medicine with any of the following medications: -adalimumab -anakinra -etanercept -infliximab -medicines that suppress your immune system -medicines to treat cancer This medicine may also interact with the following medications: -immune globulins -live virus vaccines This list may not describe all possible interactions. Give your health care provider a list of all the medicines, herbs, non-prescription drugs, or dietary supplements you use. Also tell them if you smoke, drink alcohol, or use illegal drugs. Some items may interact with your medicine. What should I watch for while using this medicine? Visit your doctor for check-ups as directed. Do not become pregnant for 3 months after receiving this vaccine. Women should inform their doctor if they wish to become pregnant or think they might be pregnant. There is a potential for serious side effects to an unborn child. Talk to your health care professional or pharmacist for more information. What side effects may I notice from receiving this medicine? Side effects that you should report to your doctor or health care professional as soon as possible: -allergic reactions like skin rash, itching or hives, swelling of the face, lips, or tongue -breathing problems -changes in hearing -changes in vision -difficulty walking -extreme changes in behavior -fast, irregular heartbeat -fever over 100 degrees F -pain, tingling, numbness in the hands or feet -seizures -unusual bleeding or bruising -unusually weak or tired Side effects that usually do not require medical attention (report to your doctor or health care professional if they continue or are bothersome): -aches or pains -  bruising, pain, swelling at site where injected -diarrhea -headache -low-grade  fever of 100 degrees F or less -nausea, vomiting -runny nose, cough -sleepy -swollen glands This list may not describe all possible side effects. Call your doctor for medical advice about side effects. You may report side effects to FDA at 1-800-FDA-1088. Where should I keep my medicine? This drug is given in a hospital or clinic and will not be stored at home. NOTE: This sheet is a summary. It may not cover all possible information. If you have questions about this medicine, talk to your doctor, pharmacist, or health care provider.    2016, Elsevier/Gold Standard. (2013-05-25 11:04:43)    Tdap Vaccine (Tetanus, Diphtheria and Pertussis): What You Need to Know 1. Why get vaccinated? Tetanus, diphtheria and pertussis are very serious diseases. Tdap vaccine can protect [place] from these diseases. And, Tdap vaccine given to pregnant women can protect newborn babies against pertussis. TETANUS (Lockjaw) is rare in the [place] today. It causes painful muscle tightening and stiffness, usually all over the body.  It can lead to tightening of muscles in the head and neck so you can't open your mouth, swallow, or sometimes even breathe. Tetanus kills about 1 out of 10 people who are infected even after receiving the best medical care. DIPHTHERIA is also rare in the [place] today. It can cause a thick coating to form in the back of the throat.  It can lead to breathing problems, heart failure, paralysis, and death. PERTUSSIS (Whooping Cough) causes severe coughing spells, which can cause difficulty breathing, vomiting and disturbed sleep.  It can also lead to weight loss, incontinence, and rib fractures. Up to 2 in 100 adolescents and 5 in 100 adults with pertussis are hospitalized or have complications, which could include pneumonia or death. These diseases are caused by bacteria. Diphtheria and pertussis are spread from person to person through secretions from coughing or sneezing. Tetanus  enters the body through cuts, scratches, or wounds. Before vaccines, as many as 200,000 cases of diphtheria, 200,000 cases of pertussis, and hundreds of cases of tetanus, were reported in the [place] each year. Since vaccination began, reports of cases for tetanus and diphtheria have dropped by about 99% and for pertussis by about 80%. 2. Tdap vaccine Tdap vaccine can protect adolescents and adults from tetanus, diphtheria, and pertussis. One dose of Tdap is routinely given at age 9 or 55. People who did not get Tdap at that age should get it as soon as possible. Tdap is especially important for healthcare professionals and anyone having close contact with a baby younger than 12 months. Pregnant women should get a dose of Tdap during every pregnancy, to protect the newborn from pertussis. Infants are most at risk for severe, life-threatening complications from pertussis. Another vaccine, called Td, protects against tetanus and diphtheria, but not pertussis. A Td booster should be given every 10 years. Tdap may be given as one of these boosters if you have never gotten Tdap before. Tdap may also be given after a severe cut or burn to prevent tetanus infection. Your doctor or the person giving you the vaccine can give you more information. Tdap may safely be given at the same time as other vaccines. 3. Some people should not get this vaccine  A person who has ever had a life-threatening allergic reaction after a previous dose of any diphtheria, tetanus or pertussis containing vaccine, OR has a severe allergy to any part of this vaccine, should not  get Tdap vaccine. Tell the person giving the vaccine about any severe allergies.  Anyone who had coma or long repeated seizures within 7 days after a childhood dose of DTP or DTaP, or a previous dose of Tdap, should not get Tdap, unless a cause other than the vaccine was found. They can still get Td.  Talk to your doctor if you:  have seizures or  another nervous system problem,  had severe pain or swelling after any vaccine containing diphtheria, tetanus or pertussis,  ever had a condition called Guillain-Barr Syndrome (GBS),  aren't feeling well on the day the shot is scheduled. 4. Risks With any medicine, including vaccines, there is a chance of side effects. These are usually mild and go away on their own. Serious reactions are also possible but are rare. Most people who get Tdap vaccine do not have any problems with it. Mild problems following Tdap (Did not interfere with activities)  Pain where the shot was given (about 3 in 4 adolescents or 2 in 3 adults)  Redness or swelling where the shot was given (about 1 person in 5)  Mild fever of at least 100.14F (up to about 1 in 25 adolescents or 1 in 100 adults)  Headache (about 3 or 4 people in 10)  Tiredness (about 1 person in 3 or 4)  Nausea, vomiting, diarrhea, stomach ache (up to 1 in 4 adolescents or 1 in 10 adults)  Chills, sore joints (about 1 person in 10)  Body aches (about 1 person in 3 or 4)  Rash, swollen glands (uncommon) Moderate problems following Tdap (Interfered with activities, but did not require medical attention)  Pain where the shot was given (up to 1 in 5 or 6)  Redness or swelling where the shot was given (up to about 1 in 16 adolescents or 1 in 12 adults)  Fever over 102F (about 1 in 100 adolescents or 1 in 250 adults)  Headache (about 1 in 7 adolescents or 1 in 10 adults)  Nausea, vomiting, diarrhea, stomach ache (up to 1 or 3 people in 100)  Swelling of the entire arm where the shot was given (up to about 1 in 500). Severe problems following Tdap (Unable to perform usual activities; required medical attention)  Swelling, severe pain, bleeding and redness in the arm where the shot was given (rare). Problems that could happen after any vaccine:  People sometimes faint after a medical procedure, including vaccination. Sitting or  lying down for about 15 minutes can help prevent fainting, and injuries caused by a fall. Tell your doctor if you feel dizzy, or have vision changes or ringing in the ears.  Some people get severe pain in the shoulder and have difficulty moving the arm where a shot was given. This happens very rarely.  Any medication can cause a severe allergic reaction. Such reactions from a vaccine are very rare, estimated at fewer than 1 in a million doses, and would happen within a few minutes to a few hours after the vaccination. As with any medicine, there is a very remote chance of a vaccine causing a serious injury or death. The safety of vaccines is always being monitored. For more information, visit: http://www.aguilar.org/ 5. What if there is a serious problem? What should I look for?  Look for anything that concerns you, such as signs of a severe allergic reaction, very high fever, or unusual behavior.  Signs of a severe allergic reaction can include hives, swelling of the face and  throat, difficulty breathing, a fast heartbeat, dizziness, and weakness. These would usually start a few minutes to a few hours after the vaccination. What should I do?  If you think it is a severe allergic reaction or other emergency that can't wait, call 9-1-1 or get the person to the nearest hospital. Otherwise, call your doctor.  Afterward, the reaction should be reported to the Vaccine Adverse Event Reporting System (VAERS). Your doctor might file this report, or you can do it yourself through the VAERS web site at www.vaers.SamedayNews.es, or by calling (769)514-2881. VAERS does not give medical advice.  6. The National Vaccine Injury Compensation Program The [employer] Vaccine Injury Compensation Program (VICP) is a federal program that was created to compensate people who may have been injured by certain vaccines. Persons who believe they may have been injured by a vaccine can learn about the program and about filing a  claim by calling (360)136-5447 or visiting the [place] website at GoldCloset.com.ee. There is a time limit to file a claim for compensation. 7. How can I learn more?  Ask your doctor. He or she can give you the vaccine package insert or suggest other sources of information.  Call your local or state health department.  Contact the Centers for Disease Control and Prevention (CDC):  Call (252)266-2235 (1-800-CDC-INFO) or  Visit CDC's website at http://hunter.com/ CDC Tdap Vaccine VIS (07/03/13)   This information is not intended to replace advice given to you by your health care provider. Make sure you discuss any questions you have with your health care provider.   Document Released: 10/26/2011 Document Revised: 05/17/2014 Document Reviewed: 08/08/2013 Elsevier Interactive Patient Education 2016 [employer].     IF you received an x-ray today, you will receive an invoice from [facility]. Please contact [facility] at [phone] with questions or concerns regarding your invoice.   IF you received labwork today, you will receive an invoice from [employer]. Please contact [facility] at [phone] with questions or concerns regarding your invoice.   Our billing staff will not be able to assist you with questions regarding bills from these companies.  You will be contacted with the lab results as soon as they are available. The fastest way to get your results is to activate your My Chart account. Instructions are located on the last page of this paperwork. If you have not heard from [place] regarding the results in 2 weeks, please contact this office.

## 2015-12-08 ENCOUNTER — Telehealth: Payer: Self-pay

## 2016-07-04 ENCOUNTER — Encounter (HOSPITAL_COMMUNITY): Payer: Self-pay | Admitting: Emergency Medicine

## 2016-07-04 ENCOUNTER — Observation Stay (HOSPITAL_COMMUNITY)
Admission: EM | Admit: 2016-07-04 | Discharge: 2016-07-05 | Disposition: A | Discharge status: Home / Self Care | Payer: BC Managed Care – PPO | Attending: Internal Medicine | Admitting: Internal Medicine

## 2016-07-04 ENCOUNTER — Emergency Department (HOSPITAL_COMMUNITY): Payer: BC Managed Care – PPO

## 2016-07-04 DIAGNOSIS — R071 Chest pain on breathing: Principal | ICD-10-CM | POA: Insufficient documentation

## 2016-07-04 DIAGNOSIS — Z79899 Other long term (current) drug therapy: Secondary | ICD-10-CM | POA: Diagnosis not present

## 2016-07-04 DIAGNOSIS — Z87891 Personal history of nicotine dependence: Secondary | ICD-10-CM | POA: Insufficient documentation

## 2016-07-04 DIAGNOSIS — I319 Disease of pericardium, unspecified: Secondary | ICD-10-CM

## 2016-07-04 DIAGNOSIS — R079 Chest pain, unspecified: Secondary | ICD-10-CM | POA: Diagnosis not present

## 2016-07-04 DIAGNOSIS — R791 Abnormal coagulation profile: Secondary | ICD-10-CM | POA: Insufficient documentation

## 2016-07-04 DIAGNOSIS — Z8249 Family history of ischemic heart disease and other diseases of the circulatory system: Secondary | ICD-10-CM

## 2016-07-04 DIAGNOSIS — M797 Fibromyalgia: Secondary | ICD-10-CM | POA: Diagnosis present

## 2016-07-04 DIAGNOSIS — I300 Acute nonspecific idiopathic pericarditis: Secondary | ICD-10-CM | POA: Diagnosis not present

## 2016-07-04 DIAGNOSIS — F411 Generalized anxiety disorder: Secondary | ICD-10-CM | POA: Diagnosis present

## 2016-07-04 HISTORY — DX: Family history of ischemic heart disease and other diseases of the circulatory system: Z82.49

## 2016-07-04 LAB — HEPATIC FUNCTION PANEL
ALT: 35 U/L (ref 14–54)
AST: 22 U/L (ref 15–41)
Albumin: 3.6 g/dL (ref 3.5–5.0)
Alkaline Phosphatase: 52 U/L (ref 38–126)
Bilirubin, Direct: 0.2 mg/dL (ref 0.1–0.5)
Indirect Bilirubin: 0.7 mg/dL (ref 0.3–0.9)
Total Bilirubin: 0.9 mg/dL (ref 0.3–1.2)
Total Protein: 6.4 g/dL — ABNORMAL LOW (ref 6.5–8.1)

## 2016-07-04 LAB — BASIC METABOLIC PANEL
Anion gap: 7 (ref 5–15)
BUN: <5 mg/dL — ABNORMAL LOW (ref 6–20)
CO2: 25 mmol/L (ref 22–32)
Calcium: 9.3 mg/dL (ref 8.9–10.3)
Chloride: 106 mmol/L (ref 101–111)
Creatinine, Ser: 0.52 mg/dL (ref 0.44–1.00)
GFR calc Af Amer: >60 mL/min (ref >60)
GFR calc non Af Amer: >60 mL/min (ref >60)
Glucose, Bld: 85 mg/dL (ref 65–99)
Potassium: 4 mmol/L (ref 3.5–5.1)
Sodium: 138 mmol/L (ref 135–145)

## 2016-07-04 LAB — TROPONIN I
Troponin I: <0.03 ng/mL (ref <0.03)
Troponin I: <0.03 ng/mL (ref <0.03)

## 2016-07-04 LAB — T4, FREE: Free T4: 1 ng/dL (ref 0.61–1.12)

## 2016-07-04 LAB — CBC
HCT: 39.7 % (ref 36.0–46.0)
HCT: 36.7 % (ref 36.0–46.0)
Hemoglobin: 13.1 g/dL (ref 12.0–15.0)
Hemoglobin: 12.2 g/dL (ref 12.0–15.0)
MCH: 30.5 pg (ref 26.0–34.0)
MCH: 30.7 pg (ref 26.0–34.0)
MCHC: 33 g/dL (ref 30.0–36.0)
MCHC: 33.2 g/dL (ref 30.0–36.0)
MCV: 92.3 fL (ref 78.0–100.0)
MCV: 92.2 fL (ref 78.0–100.0)
Platelets: 323 10*3/uL (ref 150–400)
Platelets: 297 10*3/uL (ref 150–400)
RBC: 4.3 MIL/uL (ref 3.87–5.11)
RBC: 3.98 MIL/uL (ref 3.87–5.11)
RDW: 12.6 % (ref 11.5–15.5)
RDW: 12.4 % (ref 11.5–15.5)
WBC: 6.9 10*3/uL (ref 4.0–10.5)
WBC: 6.9 10*3/uL (ref 4.0–10.5)

## 2016-07-04 LAB — CREATININE, SERUM
Creatinine, Ser: 0.58 mg/dL (ref 0.44–1.00)
GFR calc Af Amer: >60 mL/min (ref >60)
GFR calc non Af Amer: >60 mL/min (ref >60)

## 2016-07-04 LAB — TSH: TSH: 1.418 u[IU]/mL (ref 0.350–4.500)

## 2016-07-04 LAB — PROTIME-INR
INR: 1.12
Prothrombin Time: 14.4 seconds (ref 11.4–15.2)

## 2016-07-04 LAB — I-STAT TROPONIN, ED: Troponin i, poc: 0 ng/mL (ref 0.00–0.08)

## 2016-07-04 LAB — D-DIMER, QUANTITATIVE (NOT AT ARMC): D-Dimer, Quant: 0.39 ug/mL-FEU (ref 0.00–0.50)

## 2016-07-04 LAB — MAGNESIUM: Magnesium: 1.8 mg/dL (ref 1.7–2.4)

## 2016-07-04 LAB — SEDIMENTATION RATE: Sed Rate: 2 mm/hr (ref 0–22)

## 2016-07-04 MED ORDER — KETOROLAC TROMETHAMINE 30 MG/ML IJ SOLN: 30.0000 mg | Freq: Four times a day (QID) | INTRAMUSCULAR | Status: DC | PRN

## 2016-07-04 MED ORDER — KETOROLAC TROMETHAMINE 30 MG/ML IJ SOLN
30.0000 mg | Freq: Once | INTRAMUSCULAR | Status: AC
  Administered 2016-07-04: 30 mg via INTRAVENOUS
  Filled 2016-07-04: qty 1

## 2016-07-04 MED ORDER — METOPROLOL TARTRATE 12.5 MG HALF TABLET
12.5000 mg | ORAL_TABLET | Freq: Two times a day (BID) | ORAL | Status: DC
  Administered 2016-07-04 – 2016-07-05 (×2): 12.5 mg via ORAL
  Filled 2016-07-04 (×2): qty 1

## 2016-07-04 MED ORDER — ONDANSETRON HCL 4 MG/2ML IJ SOLN: 4.0000 mg | Freq: Four times a day (QID) | INTRAMUSCULAR | Status: DC | PRN

## 2016-07-04 MED ORDER — ATORVASTATIN CALCIUM 10 MG PO TABS
10.0000 mg | ORAL_TABLET | Freq: Every day | ORAL | Status: DC
  Filled 2016-07-04 (×2): qty 1

## 2016-07-04 MED ORDER — ASPIRIN 81 MG PO CHEW
324.0000 mg | CHEWABLE_TABLET | Freq: Once | ORAL | Status: AC
  Administered 2016-07-04: 324 mg via ORAL
  Filled 2016-07-04: qty 4

## 2016-07-04 MED ORDER — ACETAMINOPHEN 325 MG PO TABS: 650.0000 mg | ORAL_TABLET | ORAL | Status: DC | PRN

## 2016-07-04 MED ORDER — NITROGLYCERIN 0.4 MG SL SUBL
0.4000 mg | SUBLINGUAL_TABLET | SUBLINGUAL | Status: DC | PRN
  Filled 2016-07-04: qty 1

## 2016-07-04 MED ORDER — HEPARIN SODIUM (PORCINE) 5000 UNIT/ML IJ SOLN: 5000.0000 [IU] | Freq: Three times a day (TID) | INTRAMUSCULAR | Status: DC

## 2016-07-04 MED ORDER — SODIUM CHLORIDE 0.9 % IV SOLN: INTRAVENOUS | Status: DC

## 2016-07-04 MED ORDER — COLCHICINE 0.6 MG PO TABS
0.6000 mg | ORAL_TABLET | Freq: Two times a day (BID) | ORAL | Status: DC
  Administered 2016-07-04 – 2016-07-05 (×2): 0.6 mg via ORAL
  Filled 2016-07-04 (×2): qty 1

## 2016-07-04 MED ORDER — ASPIRIN EC 81 MG PO TBEC
81.0000 mg | DELAYED_RELEASE_TABLET | Freq: Every day | ORAL | Status: DC
  Administered 2016-07-05: 81 mg via ORAL
  Filled 2016-07-04: qty 1

## 2016-07-04 MED ORDER — ALPRAZOLAM 0.25 MG PO TABS: 0.2500 mg | ORAL_TABLET | Freq: Two times a day (BID) | ORAL | Status: DC | PRN

## 2016-07-04 MED ORDER — ZOLPIDEM TARTRATE 5 MG PO TABS: 5.0000 mg | ORAL_TABLET | Freq: Every evening | ORAL | Status: DC | PRN

## 2016-07-04 MED ORDER — SERTRALINE HCL 100 MG PO TABS
100.0000 mg | ORAL_TABLET | Freq: Every day | ORAL | Status: DC
  Administered 2016-07-05: 100 mg via ORAL
  Filled 2016-07-04: qty 1

## 2016-07-04 NOTE — ED Triage Notes (Signed)
Pt sts mid sternal CP worse at night when laying flat with some nausea and vomiting at night; pt sts pain is heaviness into back

## 2016-07-04 NOTE — ED Notes (Signed)
Checked On PT, sorry for the delay.

## 2016-07-04 NOTE — H&P (Signed)
Autumn Dixon is an 41 y.o. female.    Primary Cardiologist:new PCP:  Robyn Haber, MD  Chief Complaint: chest pain  HPI: 46 YOF with no prior medical hx but premature FH in father of CAD and death at 74 yrs. With MI.  Starting Tuesday she developed a cramping in chest lasting < 1 min.  Resolved but since then a heaviness in her chest.  Is more constant and does increase if she lies back and develops N&V.  If she sits up and leans over it is resolves.   Has been takin ibuprofen without relief.   No recent cold, fevers or any problems.    Here EKG with  SR occ PVC and biphasic T wave in V 3, I personally reviewed.   troponin is neg.   Lytes normal and CBC.  CXR with no active disease.  Ibuprofen and xanax have not helped.   Past Medical History:  Diagnosis Date  . Anxiety   . Chest pain at rest 07/04/2016  . Depression   . Family history of premature CAD 07/04/2016  . Fibromyalgia     Past Surgical History:  Procedure Laterality Date  . GANGLION CYST EXCISION Right    wrist  . KNEE ARTHROSCOPY Right   . SHOULDER ARTHROSCOPY Right     Family History  Problem Relation Age of Onset  . Colon polyps Mother   . Ovarian cysts Mother   . Heart disease Father 59  . Diabetes Maternal Grandmother   . Colon polyps Maternal Grandmother   . Colitis Maternal Grandmother     ?  Marland Kitchen Irritable bowel syndrome Maternal Grandmother   . Pancreatic cancer Maternal Grandfather   . Diabetes Maternal Grandfather   . Colon cancer Paternal Grandfather   . Heart disease Paternal Grandfather   . Diabetes Brother   . Ovarian cysts Sister   . Heart attack Paternal Aunt     died 52s with mi  . Heart disease Paternal Aunt   . Heart attack Paternal Uncle     died in early 24 s MI    Social History:  reports that she quit smoking about 19 years ago. Her smoking use included Cigarettes. She quit after 1.00 year of use. She has never used smokeless tobacco. She reports that she drinks  about 0.6 oz of alcohol per week . She reports that she does not use drugs.  Allergies: No Known Allergies  OUTPATIENT MEDICATIONS: No current facility-administered medications on file prior to encounter.    Current Outpatient Prescriptions on File Prior to Encounter  Medication Sig Dispense Refill  . alprazolam (XANAX) 2 MG tablet 1/2 tab in am and 1 tab at hs 180 tablet 5  . sertraline (ZOLOFT) 100 MG tablet Take 1 tablet (100 mg total) by mouth daily. 90 tablet 3     Results for orders placed or performed during the hospital encounter of 07/04/16 (from the past 48 hour(s))  Basic metabolic panel     Status: Abnormal   Collection Time: 07/04/16  1:36 PM  Result Value Ref Range   Sodium 138 135 - 145 mmol/L   Potassium 4.0 3.5 - 5.1 mmol/L   Chloride 106 101 - 111 mmol/L   CO2 25 22 - 32 mmol/L   Glucose, Bld 85 65 - 99 mg/dL   BUN <5 (L) 6 - 20 mg/dL   Creatinine, Ser 0.52 0.44 - 1.00 mg/dL   Calcium 9.3 8.9 - 10.3  mg/dL   GFR calc non Af Amer >60 >60 mL/min   GFR calc Af Amer >60 >60 mL/min    Comment: (NOTE) The eGFR has been calculated using the CKD EPI equation. This calculation has not been validated in all clinical situations. eGFR's persistently <60 mL/min signify possible Chronic Kidney Disease.    Anion gap 7 5 - 15  CBC     Status: None   Collection Time: 07/04/16  1:36 PM  Result Value Ref Range   WBC 6.9 4.0 - 10.5 K/uL   RBC 4.30 3.87 - 5.11 MIL/uL   Hemoglobin 13.1 12.0 - 15.0 g/dL   HCT 39.7 36.0 - 46.0 %   MCV 92.3 78.0 - 100.0 fL   MCH 30.5 26.0 - 34.0 pg   MCHC 33.0 30.0 - 36.0 g/dL   RDW 12.6 11.5 - 15.5 %   Platelets 323 150 - 400 K/uL  I-stat troponin, ED     Status: None   Collection Time: 07/04/16  1:46 PM  Result Value Ref Range   Troponin i, poc 0.00 0.00 - 0.08 ng/mL   Comment 3            Comment: Due to the release kinetics of cTnI, a negative result within the first hours of the onset of symptoms does not rule out myocardial  infarction with certainty. If myocardial infarction is still suspected, repeat the test at appropriate intervals.    Dg Chest 2 View  Result Date: 07/04/2016 CLINICAL DATA:  Chest pain and shortness of breath for 1 day. EXAM: CHEST  2 VIEW COMPARISON:  None. FINDINGS: The cardiomediastinal silhouette is unremarkable. There is no evidence of focal airspace disease, pulmonary edema, suspicious pulmonary nodule/mass, pleural effusion, or pneumothorax. No acute bony abnormalities are identified. IMPRESSION: No active cardiopulmonary disease. Electronically Signed   By: Margarette Canada M.D.   On: 07/04/2016 14:10    ROS: General:no colds or fevers, no weight changes Skin:no rashes or ulcers HEENT:no blurred vision, no congestion CV:see HPI PUL:see HPI GI:no diarrhea constipation or melena, no indigestion GU:no hematuria, no dysuria MS:no joint pain, no claudication Neuro:no syncope, no lightheadedness Endo:no diabetes, no thyroid disease GYN:  pt has IUD, denies pregnancy    Blood pressure 108/67, pulse 74, temperature 99 F (37.2 C), temperature source Oral, resp. rate 16, height '5\' 11"'$  (1.803 m), weight 180 lb (81.6 kg), SpO2 100 %. PE: General:Pleasant affect, NAD Skin:Warm and dry, brisk capillary refill HEENT:normocephalic, sclera clear, mucus membranes moist Neck:supple, no JVD, no bruits  Heart:S1S2 RRR without murmur, gallup, rub or click Lungs:clear without rales, rhonchi, or wheezes XWR:UEAV, non tender, + BS, do not palpate liver spleen or masses Ext:no lower ext edema, 2+ pedal pulses, 2+ radial pulses Neuro:alert and oriented X 3, MAE, follows commands, + facial symmetry    Assessment/Plan Principal Problem:   Chest pain at rest-  On description seems more like pericarditis, better sitting up.  I do not hear a rub.  Will check sed rate and echo.  Serial troponins.  And cardiac CTA in AM.  Dr. Haroldine Laws has seen.    Active Problems:   Family history of premature  CAD    Cecilie Kicks Nurse Practitioner Certified [place] Pager [phone] or after 5pm or weekends call [phone] 07/04/2016, 4:48 PM   Patient seen and examined with Cecilie Kicks, NP. We discussed all aspects of the encounter. I agree with the assessment and plan as stated above.   Healthy 40  y/o woman presents with 5 days of CP that is intermittent and mostly positional. ECG mildly abnormal with PVC and TWI in v3. Father had fatal MI at 83. No rub on exam. Troponin normal.   I agree that this is likely percarditis. No evidence of SCAD. Will treat with toradol and colchicine. Plan echo and cardiac CTA tomorrow. Cycle troponins.   Xabi Wittler,MD 5:25 PM

## 2016-07-04 NOTE — ED Provider Notes (Signed)
[facility]-EMER[facility]ENCY DEPT Provider Note   CSN: [id number] Arrival date & time: 07/04/16  1248     History   Chief Complaint Chief Complaint  Patient presents with  . Chest Pain    HPI Autumn Dixon is a 41 y.o. female.  The history is provided by the patient.  Chest Pain   This is a new problem. The current episode started more than 2 days ago. The problem occurs daily. The problem has been gradually worsening. The pain is associated with exertion. The pain is present in the substernal region. The pain is moderate. The quality of the pain is described as exertional and heavy. The pain radiates to the left shoulder. Duration of episode(s) is 5 days. The symptoms are aggravated by exertion and certain positions. Associated symptoms include nausea and vomiting. Pertinent negatives include no abdominal pain, no back pain, no cough, no fever, no palpitations and no shortness of breath. Treatments tried: NSAIDs, Xanax. The treatment provided no relief. Risk factors: Early onset CAD in father (age 70)  Pertinent negatives for past medical history include no seizures.  Her family medical history is significant for heart disease.    Past Medical History:  Diagnosis Date  . Anxiety   . Chest pain at rest 07/04/2016  . Depression   . Family history of premature CAD 07/04/2016  . Fibromyalgia     Patient Active Problem List   Diagnosis Date Noted  . Chest pain at rest 07/04/2016  . Family history of premature CAD 07/04/2016  . Anxiety state, unspecified 12/24/2013  . Contraception 12/24/2013    Past Surgical History:  Procedure Laterality Date  . GANGLION CYST EXCISION Right    wrist  . KNEE ARTHROSCOPY Right   . SHOULDER ARTHROSCOPY Right     OB History    No data available       Home Medications    Prior to Admission medications   Medication Sig Start Date End Date Taking? Authorizing Provider  alprazolam Prudy Feeler) 2 MG tablet 1/2 tab in am and 1 tab at hs Patient taking  differently: Take 1 mg by mouth daily as needed. 1/2 tab in am and 1 tab at hs 07/15/15  Yes Elvina Sidle, MD  sertraline (ZOLOFT) 100 MG tablet Take 1 tablet (100 mg total) by mouth daily. 07/15/15  Yes Elvina Sidle, MD    Family History Family History  Problem Relation Age of Onset  . Colon polyps Mother   . Ovarian cysts Mother   . Heart disease Father 61  . Diabetes Maternal Grandmother   . Colon polyps Maternal Grandmother   . Colitis Maternal Grandmother     ?  Marland Kitchen Irritable bowel syndrome Maternal Grandmother   . Pancreatic cancer Maternal Grandfather   . Diabetes Maternal Grandfather   . Colon cancer Paternal Grandfather   . Heart disease Paternal Grandfather   . Diabetes Brother   . Ovarian cysts Sister   . Heart attack Paternal Aunt     died 54s with mi  . Heart disease Paternal Aunt   . Heart attack Paternal Uncle     died in early 78 s MI     Social History Social History  Substance Use Topics  . Smoking status: Former Smoker    Years: 1.00    Types: Cigarettes    Quit date: 05/10/1997  . Smokeless tobacco: Never Used  . Alcohol use 0.6 oz/week    1 Glasses of wine per week     Comment:  occasional     Allergies   Patient has no known allergies.   Review of Systems Review of Systems  Constitutional: Negative for chills and fever.  HENT: Negative for ear pain and sore throat.   Eyes: Negative for pain and visual disturbance.  Respiratory: Negative for cough and shortness of breath.   Cardiovascular: Positive for chest pain. Negative for palpitations.  Gastrointestinal: Positive for nausea and vomiting. Negative for abdominal pain.  Genitourinary: Negative for dysuria and hematuria.  Musculoskeletal: Negative for arthralgias and back pain.  Skin: Negative for color change and rash.  Neurological: Negative for seizures and syncope.  All other systems reviewed and are negative.    Physical Exam Updated Vital Signs BP 126/84   Pulse 86   Temp 99  F (37.2 C) (Oral)   Resp 21   Ht 5\' 11"  (1.803 m)   Wt 81.6 kg   SpO2 99%   BMI 25.10 kg/m   Physical Exam  Constitutional: She is oriented to person, place, and time. She appears well-developed and well-nourished. No distress.  HENT:  Head: Normocephalic and atraumatic.  Eyes: Conjunctivae are normal.  Neck: Neck supple.  Cardiovascular: Normal rate and regular rhythm.   No murmur heard. Pulmonary/Chest: Effort normal and breath sounds normal. No respiratory distress. She has no wheezes. She has no rales.  Abdominal: Soft. There is no tenderness.  Musculoskeletal: She exhibits no edema, tenderness or deformity.  Neurological: She is alert and oriented to person, place, and time.  Skin: Skin is warm and dry.  Psychiatric: She has a normal mood and affect.  Nursing note and vitals reviewed.    ED Treatments / Results  Labs (all labs ordered are listed, but only abnormal results are displayed) Labs Reviewed  BASIC METABOLIC PANEL - Abnormal; Notable for the following:       Result Value   BUN <5 (*)    All other components within normal limits  CBC  D-DIMER, QUANTITATIVE (NOT AT [facility])  CBC  CREATININE, SERUM  MAGNESIUM  HEPATIC FUNCTION PANEL  TSH  T4, FREE  TROPONIN I  TROPONIN I  TROPONIN I  HEMOGLOBIN A1C  PROTIME-INR  BASIC METABOLIC PANEL  LIPID PANEL  SEDIMENTATION RATE  I-STAT TROPOININ, ED    EKG  EKG Interpretation  Date/Time:  Sunday July 04 2016 12:59:34 EST Ventricular Rate:  88 PR Interval:  138 QRS Duration: 82 QT Interval:  386 QTC Calculation: 467 R Axis:   64 Text Interpretation:  Sinus rhythm with frequent Premature ventricular complexes No previous tracing Confirmed by Denton Lank  MD, Caryn Bee ([id number]) on 07/04/2016 1:28:24 PM       Radiology Dg Chest 2 View  Result Date: 07/04/2016 CLINICAL DATA:  Chest pain and shortness of breath for 1 day. EXAM: CHEST  2 VIEW COMPARISON:  None. FINDINGS: The cardiomediastinal silhouette is  unremarkable. There is no evidence of focal airspace disease, pulmonary edema, suspicious pulmonary nodule/mass, pleural effusion, or pneumothorax. No acute bony abnormalities are identified. IMPRESSION: No active cardiopulmonary disease. Electronically Signed   By: Harmon Pier M.D.   On: 07/04/2016 14:10    Procedures Procedures (including critical care time)  Medications Ordered in ED Medications  ketorolac (TORADOL) 30 MG/ML injection 30 mg (not administered)  aspirin EC tablet 81 mg (not administered)  nitroGLYCERIN (NITROSTAT) SL tablet 0.4 mg (not administered)  acetaminophen (TYLENOL) tablet 650 mg (not administered)  ondansetron (ZOFRAN) injection 4 mg (not administered)  heparin injection 5,000 Units (not administered)  0.9 %  sodium chloride infusion (not administered)  metoprolol tartrate (LOPRESSOR) tablet 12.5 mg (not administered)  atorvastatin (LIPITOR) tablet 10 mg (not administered)  zolpidem (AMBIEN) tablet 5 mg (not administered)  ALPRAZolam (XANAX) tablet 0.25 mg (not administered)  colchicine tablet 0.6 mg (not administered)  ketorolac (TORADOL) 30 MG/ML injection 30 mg (not administered)  aspirin chewable tablet 324 mg (324 mg Oral Given 07/04/16 1646)     Initial Impression / Assessment and Plan / ED Course  I have reviewed the triage vital signs and the nursing notes.  Pertinent labs & imaging results that were available during my care of the patient were reviewed by me and considered in my medical decision making (see chart for details).    Pt p/w about 5 days of progressive chest heaviness. Has had two episodes associated with n/v. Worse with exertion and lying flat. No hx of CAD or other personal medical history, although her father died at age 39 from CAD. Pt's pain has been progressive. It is worse with lying down, better leaning forward which would suggest possible pericarditis. No recent illness otherwise. However, exertional nature, n/v, and family hx are  concerning for ischemic chest pain.  Initial trop negative. EKG repeated and repeat EKG appears normal. Initial EKG with frequent PVCs and poor baseline tracing. Labs unremarkable. Due to concerning hx, cardiology consulted and has admitted patient.  Final Clinical Impressions(s) / ED Diagnoses   Final diagnoses:  Chest pain at rest    New Prescriptions New Prescriptions   No medications on file     Lennette BihariJohn Michael Jourdyn Ferrin, MD 07/04/16 1745    Blane OharaJoshua Zavitz, MD 07/04/16 2330

## 2016-07-04 NOTE — Plan of Care (Signed)
Problem: Pain Managment: Goal: General experience of comfort will improve Outcome: Progressing Patient denies pain at this time. Patient will notify RN if she develops pain; understands that she has PRN toradol ordered for pain.

## 2016-07-04 NOTE — ED Notes (Signed)
Cardiology at bedside.

## 2016-07-05 ENCOUNTER — Other Ambulatory Visit (HOSPITAL_COMMUNITY): Payer: BC Managed Care – PPO

## 2016-07-05 ENCOUNTER — Observation Stay (HOSPITAL_BASED_OUTPATIENT_CLINIC_OR_DEPARTMENT_OTHER): Payer: BC Managed Care – PPO

## 2016-07-05 ENCOUNTER — Encounter (HOSPITAL_COMMUNITY): Payer: Self-pay | Admitting: Radiology

## 2016-07-05 ENCOUNTER — Observation Stay (HOSPITAL_COMMUNITY): Payer: BC Managed Care – PPO

## 2016-07-05 DIAGNOSIS — M797 Fibromyalgia: Secondary | ICD-10-CM | POA: Diagnosis present

## 2016-07-05 DIAGNOSIS — R079 Chest pain, unspecified: Secondary | ICD-10-CM | POA: Diagnosis not present

## 2016-07-05 DIAGNOSIS — Z87891 Personal history of nicotine dependence: Secondary | ICD-10-CM | POA: Diagnosis not present

## 2016-07-05 DIAGNOSIS — R791 Abnormal coagulation profile: Secondary | ICD-10-CM | POA: Diagnosis not present

## 2016-07-05 DIAGNOSIS — I319 Disease of pericardium, unspecified: Secondary | ICD-10-CM | POA: Diagnosis not present

## 2016-07-05 DIAGNOSIS — Z79899 Other long term (current) drug therapy: Secondary | ICD-10-CM | POA: Diagnosis not present

## 2016-07-05 DIAGNOSIS — R071 Chest pain on breathing: Secondary | ICD-10-CM | POA: Diagnosis not present

## 2016-07-05 LAB — ECHOCARDIOGRAM COMPLETE
E decel time: 277 msec
FS: 28 % (ref 28–44)
Height: 71 in
IVS/LV PW RATIO, ED: 0.8
LA ID, A-P, ES: 31 mm
LA diam end sys: 31 mm
LA diam index: 1.51 cm/m2
LA vol A4C: 49.7 ml
LA vol index: 27.3 mL/m2
LA vol: 56.1 mL
LV PW d: 10 mm — AB (ref 0.6–1.1)
LV e' LATERAL: 19.9 cm/s
LVOT SV: 112 mL
LVOT VTI: 22.9 cm
LVOT area: 4.91 cm2
LVOT diameter: 25 mm
LVOT peak grad rest: 5 mmHg
LVOT peak vel: 108 cm/s
Lateral S' vel: 14 cm/s
MV Dec: 277
MV pk E vel: 1.1 m/s
RV sys press: 23 mmHg
Reg peak vel: 221 cm/s
TAPSE: 28.1 mm
TDI e' lateral: 19.9
TDI e' medial: 11.3
TR max vel: 221 cm/s
Weight: 2944 oz

## 2016-07-05 LAB — BASIC METABOLIC PANEL
Anion gap: 5 (ref 5–15)
BUN: 7 mg/dL (ref 6–20)
CO2: 27 mmol/L (ref 22–32)
Calcium: 8.8 mg/dL — ABNORMAL LOW (ref 8.9–10.3)
Chloride: 107 mmol/L (ref 101–111)
Creatinine, Ser: 0.52 mg/dL (ref 0.44–1.00)
GFR calc Af Amer: >60 mL/min (ref >60)
GFR calc non Af Amer: >60 mL/min (ref >60)
Glucose, Bld: 78 mg/dL (ref 65–99)
Potassium: 4.2 mmol/L (ref 3.5–5.1)
Sodium: 139 mmol/L (ref 135–145)

## 2016-07-05 LAB — LIPID PANEL
Cholesterol: 136 mg/dL (ref 0–200)
HDL: 59 mg/dL (ref >40)
LDL Cholesterol: 68 mg/dL (ref 0–99)
Total CHOL/HDL Ratio: 2.3 RATIO
Triglycerides: 44 mg/dL (ref <150)
VLDL: 9 mg/dL (ref 0–40)

## 2016-07-05 LAB — HEMOGLOBIN A1C
Hgb A1c MFr Bld: 5 % (ref 4.8–5.6)
Mean Plasma Glucose: 97 mg/dL

## 2016-07-05 LAB — TROPONIN I: Troponin I: <0.03 ng/mL (ref <0.03)

## 2016-07-05 MED ORDER — COLCHICINE 0.6 MG PO TABS: 0.6000 mg | ORAL_TABLET | Freq: Two times a day (BID) | ORAL | 0 refills | Status: DC

## 2016-07-05 MED ORDER — METOPROLOL TARTRATE 5 MG/5ML IV SOLN
5.0000 mg | INTRAVENOUS | Status: DC | PRN
  Administered 2016-07-05: 5 mg via INTRAVENOUS

## 2016-07-05 MED ORDER — METOPROLOL TARTRATE 5 MG/5ML IV SOLN
10.0000 mg | INTRAVENOUS | Status: DC | PRN
  Filled 2016-07-05 (×2): qty 10

## 2016-07-05 MED ORDER — IOPAMIDOL (ISOVUE-370) INJECTION 76%
INTRAVENOUS | Status: AC
  Administered 2016-07-05: 100 mL
  Filled 2016-07-05: qty 100

## 2016-07-05 MED ORDER — NITROGLYCERIN 0.4 MG SL SUBL
0.8000 mg | SUBLINGUAL_TABLET | Freq: Once | SUBLINGUAL | Status: AC
  Administered 2016-07-05: 0.8 mg via SUBLINGUAL

## 2016-07-05 MED ORDER — METOPROLOL TARTRATE 25 MG PO TABS: 12.5000 mg | ORAL_TABLET | Freq: Two times a day (BID) | ORAL | 3 refills | Status: DC

## 2016-07-05 MED ORDER — METOPROLOL TARTRATE 25 MG PO TABS
25.0000 mg | ORAL_TABLET | Freq: Once | ORAL | Status: AC
  Administered 2016-07-05: 25 mg via ORAL
  Filled 2016-07-05: qty 1

## 2016-07-05 NOTE — Progress Notes (Addendum)
Patient Name: Autumn Dixon Date of Encounter: 07/05/2016  Primary Cardiologist: New- seen by Dr. Clarise Cruz.   Hospital Problem List     Principal Problem:   Chest pain at rest Active Problems:   Family history of premature CAD     Subjective   Chest pain has improved. Wants to go home- hoping to leave before 2  Inpatient Medications    Scheduled Meds: . aspirin EC  81 mg Oral Daily  . atorvastatin  10 mg Oral q1800  . colchicine  0.6 mg Oral BID  . heparin  5,000 Units Subcutaneous Q8H  . metoprolol tartrate  12.5 mg Oral BID  . sertraline  100 mg Oral Daily   Continuous Infusions: . sodium chloride     PRN Meds: acetaminophen, ALPRAZolam, ketorolac, nitroGLYCERIN, ondansetron (ZOFRAN) IV, zolpidem   Vital Signs    Vitals:   07/04/16 1815 07/04/16 2059 07/04/16 2100 07/05/16 0615  BP: 113/68  116/71 106/61  Pulse:  75 73 74  Resp: 18  16   Temp:   98.7 F (37.1 C) 98 F (36.7 C)  TempSrc:   Oral Oral  SpO2: 99%  100% 99%  Weight:    184 lb (83.5 kg)  Height:    5\' 11"  (1.803 m)    Intake/Output Summary (Last 24 hours) at 07/05/16 0931 Last data filed at 07/05/16 0852  Gross per 24 hour  Intake              360 ml  Output                0 ml  Net              360 ml   Filed Weights   07/04/16 1302 07/05/16 0615  Weight: 180 lb (81.6 kg) 184 lb (83.5 kg)    Physical Exam   GEN: Well nourished, well developed, in no acute distress.  HEENT: Grossly normal.  Neck: Supple, no JVD, carotid bruits, or masses. Cardiac: RRR, no murmurs, rubs, or gallops. No clubbing, cyanosis, edema.  Radials/DP/PT 2+ and equal bilaterally.  Respiratory:  Respirations regular and unlabored, clear to auscultation bilaterally. GI: Soft, nontender, nondistended, BS + x 4. MS: no deformity or atrophy. Skin: warm and dry, no rash. Neuro:  Strength and sensation are intact. Psych: AAOx3.  Normal affect.  Labs    CBC  Recent Labs  07/04/16 1336 07/04/16 1729  WBC  6.9 6.9  HGB 13.1 12.2  HCT 39.7 36.7  MCV 92.3 92.2  PLT 323 297   Basic Metabolic Panel  Recent Labs  07/04/16 1336 07/04/16 1729 07/05/16 0423  NA 138  --  139  K 4.0  --  4.2  CL 106  --  107  CO2 25  --  27  GLUCOSE 85  --  78  BUN <5*  --  7  CREATININE 0.52 0.58 0.52  CALCIUM 9.3  --  8.8*  MG  --  1.8  --    Liver Function Tests  Recent Labs  07/04/16 1729  AST 22  ALT 35  ALKPHOS 52  BILITOT 0.9  PROT 6.4*  ALBUMIN 3.6   No results for input(s): LIPASE, AMYLASE in the last 72 hours. Cardiac Enzymes  Recent Labs  07/04/16 1729 07/04/16 2236 07/05/16 0423  TROPONINI <0.03 <0.03 <0.03   BNP Invalid input(s): POCBNP D-Dimer  Recent Labs  07/04/16 1336  DDIMER 0.39   Hemoglobin A1C  Recent Labs  07/04/16  1859  HGBA1C 5.0   Fasting Lipid Panel  Recent Labs  07/05/16 0423  CHOL 136  HDL 59  LDLCALC 68  TRIG 44  CHOLHDL 2.3   Thyroid Function Tests  Recent Labs  07/04/16 1729  TSH 1.418    Telemetry    NSR - Personally Reviewed  ECG    NSR HR 73 - Personally Reviewed  Radiology    Dg Chest 2 View  Result Date: 07/04/2016 CLINICAL DATA:  Chest pain and shortness of breath for 1 day. EXAM: CHEST  2 VIEW COMPARISON:  None. FINDINGS: The cardiomediastinal silhouette is unremarkable. There is no evidence of focal airspace disease, pulmonary edema, suspicious pulmonary nodule/mass, pleural effusion, or pneumothorax. No acute bony abnormalities are identified. IMPRESSION: No active cardiopulmonary disease. Electronically Signed   By: Harmon PierJeffrey  Hu M.D.   On: 07/04/2016 14:10    Cardiac Studies   2D ECHO and cardiac CT pending  Patient Profile     Autumn MorrowMary A Crisco is a 41 y.o. female with a history of fibromyalgia and premature family history of CAD ( father had MI at 7523) [facility] with chest pain.   Hospital Course     Consultants: none  Chest pain: she presented with 5 days of CP that is  intermittent and mostly positional (wores supine and better leaning forward). ECG mildly abnormal with PVC and TWI in v3. Troponin normal. Father had fatal MI at 5623. No rub on exam. Dr. Gala RomneyBensimhon felt like this was most likely percarditis. No evidence of SCAD. Treated with toradol and colchicine. Plan echo and cardiac CTA today Possible discharge today if all tests reassuring. Will try to expedite this study   Signed, Cline CrockKathryn Thompson, PA-C  07/05/2016, 9:31 AM   Patient seen and examined. Agree with assessment and plan. Echo is normal; cardiac CT normal coronaries; Calcium score 0. Troponins negative.  Feels better; no definite friction rub on exam.  Previous positional symptoms with supine posture resolving with upright sitting.  On treatment for probable pericardial pain; no effusion.  Can dc later today on short term colchicine and NSAID Rx, Consider short term PPI or H2 blocker. F/u in office next week.    Lennette Biharihomas A. Kelly, MD, [facility] 07/05/2016 3:01 PM

## 2016-07-05 NOTE — Discharge Instructions (Signed)
Pericarditis °Pericarditis is swelling and irritation (inflammation) of your pericardium. The pericardium is a thin, double-layered, fluid-filled sac that surrounds your heart. The pericardium protects and holds your heart in your chest cavity. °Inflammation of your pericardium can cause rubbing (friction) between the two layers when your heart beats. Fluid may build up between the layers of the sac (pericardial effusion). °Different types of pericarditis include: °· Acute pericarditis. Inflammation develops suddenly and causes pericardial effusion. °· Chronic pericarditis. Inflammation may develop gradually, or it may continue after acute pericarditis and last longer than 6 months. °· Constrictive pericarditis. The layers of the pericardium stiffen and develop scar tissue. The scar tissue thickens and sticks together. This makes it difficult for the heart to pump and to work as it normally does. This type is rare. °In most cases, pericarditis is acute and not serious. Chronic pericarditis and constrictive pericarditis may be more serious and may require treatment. °What are the causes? °Often, the cause of pericarditis is not known. If a cause is found, the cause may be: °· A viral infection. °· A heart attack (myocardial infarction). °· Open-heart surgery (coronary artery bypass graft surgery). °· Chest injury. °· Autoimmune conditions, such as lupus or rheumatoid arthritis. °· Kidney failure. °· Low-functioning thyroid gland (hypothyroidism). °· Cancer from another part of the body that has spread (metastasized) to the pericardium. °· Radiation treatment. °· Certain medicines, including some seizure medicines, blood thinners, heart medicines, and antibiotics. °· A bacterial or fungal infection. This cause is less common. °What increases the risk? °The following factors may increase your risk of pericarditis: °· Being female. °· Being 20-50 years old. °· Having had pericarditis before. °· Having had a recent upper  respiratory tract infection. °What are the signs or symptoms? °The most common symptom of pericarditis is chest pain. This pain may: °· Be in the center of your chest or the left side of your chest. °· Not go away with rest. °· Last for many hours or days. °· Worsen when you lie down and go away when you sit up and lean forward. °· Worsen when you swallow. °· Move to your back, neck, or shoulder. °Other symptoms may include: °· A chronic, dry cough. °· Heart palpitations. These may feel like rapid, fluttering, or pounding heartbeats. °· Dizziness or fainting. °· Tiredness or fatigue. °· Fever. °· Rapid breathing. °· Shortness of breath when lying down. °How is this diagnosed? °This condition is diagnosed with a medical history, physical exam, and diagnostic tests. During your physical exam, your health care provider will listen for friction while your heart beats (pericardial rub). You may also have tests, including: °· Blood work to look for signs of infection and inflammation. °· Electrocardiogram (ECG). °· Echocardiogram. °· CT scan. °· MRI. °· Culture of pericardial fluid. °· A tissue sample (biopsy) of the pericardium. °If tests show that you may have constrictive pericarditis, you may have a procedure (cardiac catheterization) to confirm this diagnosis. °How is this treated? °Treatment for this condition depends on the cause and type of pericarditis. In most cases, acute pericarditis will clear up on its own within 10 days. Treatment for other types of pericarditis may include: °· Medicines, such as: °¨ NSAIDs for pain and inflammation. °¨ Steroids to reduce inflammation. °¨ Colchicine to relieve pain and inflammation. °· A procedure to remove fluid using a needle (pericardiocentesis) if pericardial effusion puts pressure on the heart. °· Surgery to remove part of the pericardium if constrictive pericarditis develops. °If another condition is causing   your pericarditis, you may need treatment for that  underlying condition. °Follow these instructions at home: °· Do not use tobacco products, including cigarettes, chewing tobacco, or e-cigarettes. If you need help quitting, ask your health care provider. °· Maintain a healthy weight. °· Follow an exercise program as told by your health care provider. You may need to limit your exercise until your symptoms go away. °· Eat a heart-healthy diet. A registered dietitian can help you to learn about healthy food choices. °· Take over-the-counter and prescription medicines only as told by your health care provider. Keep a list of all of your medicines with you at all times. For each medicine, include information about the name, the dosage, how often you take it, and how you take it. °· Keep all follow-up visits as told by your health care provider. This is important. °Contact a health care provider if: °· You continue to have symptoms of pericarditis. °· You develop new symptoms of pericarditis. °· Your symptoms get worse. °Get help right away if: °· You have worsening chest pain and difficulty breathing. °These symptoms may represent a serious problem that is an emergency. Do not wait to see if the symptoms will go away. Get medical help right away. Call your local emergency services (911 in the [place] Do not drive yourself to the hospital.  °This information is not intended to replace advice given to you by your health care provider. Make sure you discuss any questions you have with your health care provider. °Document Released: 10/20/2000 Document Revised: 09/29/2015 Document Reviewed: 11/06/2014 °Elsevier Interactive Patient Education © 2017 [employer] ° °

## 2016-07-05 NOTE — Care Management Note (Addendum)
Case Management Note  Patient Details  Name: Autumn Dixon MRN: [id number] Date of Birth: 12/18/1975  Subjective/Objective:  Pt presented for Chest Pain. Question Pericarditis. Pt is from home. Plan to return home. No needs identified. Benefits check completed in case pt will need Colchicine for home. Patient is aware of cost if needed. No further needs identified from CM at this time.                   Action/Plan: S/W [facility] @ [facility] # [phone]   COLCHICINE TABLET 0.6 MG BID   COVER- YES  CO-PAY- $ 30.00  PRIOR APPROVAL- NO   [facility] : [facility]    Expected Discharge Date:  07/05/16               Expected Discharge Plan:  Home/Self Care  In-House Referral:  NA  Discharge planning Services  CM Consult, Medication Assistance Post Acute Care Choice:  NA Choice offered to:  NA  DME Arranged:  Dan HumphreysWalker, N/A DME Agency:  NA  HH Arranged:  NA HH Agency:  NA  Status of Service:  Completed, signed off  If discussed at Long Length of Stay Meetings, dates discussed:    Additional Comments:  Gala LewandowskyGraves-Bigelow, Rexanne Inocencio Kaye, RN 07/05/2016, 12:47 PM

## 2016-07-05 NOTE — Discharge Summary (Signed)
Discharge Summary    Patient ID: Autumn Dixon,  MRN: [id number], DOB/AGE: 1975/11/06 41 y.o.  Admit date: 07/04/2016 Discharge date: 07/05/2016  Primary Care Provider: Elvina Sidle Primary Cardiologist: New- Dr. Tresa Endo    Discharge Diagnoses    Principal Problem:   Chest pain at rest Active Problems:   Anxiety state   Family history of premature CAD   Fibromyalgia   Allergies No Known Allergies   History of Present Illness     Autumn Dixon is a 41 y.o. female with a history of fibromyalgia and premature family history of CAD ( father had MI at 76) who presented to [facility] on 07/04/16 with chest pain.   She developed a Cramping in her chest since2/20/18 that was worse when she laid back and better when leaning forward.   Hospital Course     Consultants: none  Chest pain: she presented with 5 days of CP that is intermittent and mostly positional (wores supine and better leaning forward). ECG mildly abnormal with PVC and TWI in v3. Troponin normal. Father had fatal MI at 55. No rub on exam. Dr. Gala Romney felt like this was most likely percarditis. No evidence of SCAD. Treated with toradol and colchicine. Cardiac CTA today showed normal coronary arteries with a calcium score of 0. 2D ECHO showed no pericardial effusion and Sed rate normal. Will discharge her home on colchicine 0.6mg  BID and ibuprofen 600mg  TID x 1 week with an office follow up in 1 week.  PVCs: reassured her that these are benign. Started on lopressor 12.5mg  BID   The patient has had an uncomplicated hospital course and is recovering well. She has been seen by Dr. Tresa Endo today and deemed ready for discharge home. All follow-up appointments have been scheduled. Discharge medications are listed below.  _____________  Discharge Vitals Blood pressure 112/64, pulse 71, temperature 98.1 F (36.7 C), temperature source Oral, resp. rate 17, height 5\' 11"  (1.803 m), weight 184 lb (83.5 kg), SpO2 98 %.  Filed  Weights   07/04/16 1302 07/05/16 0615  Weight: 180 lb (81.6 kg) 184 lb (83.5 kg)    Labs & Radiologic Studies     CBC  Recent Labs  07/04/16 1336 07/04/16 1729  WBC 6.9 6.9  HGB 13.1 12.2  HCT 39.7 36.7  MCV 92.3 92.2  PLT 323 297   Basic Metabolic Panel  Recent Labs  07/04/16 1336 07/04/16 1729 07/05/16 0423  NA 138  --  139  K 4.0  --  4.2  CL 106  --  107  CO2 25  --  27  GLUCOSE 85  --  78  BUN <5*  --  7  CREATININE 0.52 0.58 0.52  CALCIUM 9.3  --  8.8*  MG  --  1.8  --    Liver Function Tests  Recent Labs  07/04/16 1729  AST 22  ALT 35  ALKPHOS 52  BILITOT 0.9  PROT 6.4*  ALBUMIN 3.6   No results for input(s): LIPASE, AMYLASE in the last 72 hours. Cardiac Enzymes  Recent Labs  07/04/16 1729 07/04/16 2236 07/05/16 0423  TROPONINI <0.03 <0.03 <0.03   BNP Invalid input(s): POCBNP D-Dimer  Recent Labs  07/04/16 1336  DDIMER 0.39   Hemoglobin A1C  Recent Labs  07/04/16 1859  HGBA1C 5.0   Fasting Lipid Panel  Recent Labs  07/05/16 0423  CHOL 136  HDL 59  LDLCALC 68  TRIG 44  CHOLHDL 2.3   Thyroid  Function Tests  Recent Labs  07/04/16 1729  TSH 1.418    Dg Chest 2 View  Result Date: 07/04/2016 CLINICAL DATA:  Chest pain and shortness of breath for 1 day. EXAM: CHEST  2 VIEW COMPARISON:  None. FINDINGS: The cardiomediastinal silhouette is unremarkable. There is no evidence of focal airspace disease, pulmonary edema, suspicious pulmonary nodule/mass, pleural effusion, or pneumothorax. No acute bony abnormalities are identified. IMPRESSION: No active cardiopulmonary disease. Electronically Signed   By: Harmon Pier M.D.   On: 07/04/2016 14:10   Ct Coronary Morph W/cta Cor W/score W/ca W/cm &/or Wo/cm  Addendum Date: 21-Jul-2016   ADDENDUM REPORT: 07-21-2016 12:40 CLINICAL DATA:  Chest pain EXAM: Cardiac CTA MEDICATIONS: Sub lingual nitro. 4mg  and lopressor 10mg  TECHNIQUE: The patient was scanned on a Philips 256 slice  scanner. Gantry rotation speed was 270 msecs. Collimation was .9mm. A 100 kV prospective scan was triggered in the descending thoracic aorta at 111 HU's with 5% padding centered around 78% of the R-R interval. Average HR during the scan was 68 bpm. The 3D data set was interpreted on a dedicated work station using MPR, MIP and VRT modes. A total of 80cc of contrast was used. FINDINGS: Non-cardiac: See separate report from [facility]. No significant findings on limited lung and soft tissue windows. Calcium Score: Coronary Arteries: Right dominant with no anomalies LM:  Normal LAD:  Normal D1:  Normal D2:  Normal Circumflex:  Normal OM1:  Normal OM2:  Normal RCA:  Dominant and normal PDA:  Normal PLA:  Normal IMPRESSION: 1) Calcium score 0 2) Normal right dominant coronary arteries 3) Normal aorta root measures 2.9 cm Charlton Haws Electronically Signed   By: Charlton Haws M.D.   On: 2016-07-21 12:40   Result Date: Jul 21, 2016 EXAM: OVER-READ INTERPRETATION  CT CHEST The following report is an over-read performed by radiologist Dr. Noe Gens [facility] on 07-21-16. This over-read does not include interpretation of cardiac or coronary anatomy or pathology. The coronary CTA interpretation by the cardiologist is attached. COMPARISON:  None. FINDINGS: Cardiovascular: Heart is normal size. Visualized aorta normal caliber. Mediastinum/Nodes: No adenopathy in the visualized lower mediastinum or hila. Lungs/Pleura: Visualized lungs are clear.  No effusions. Upper Abdomen: Imaging into the upper abdomen shows no acute findings. Musculoskeletal: Visualized chest wall soft tissues unremarkable. No acute bony abnormality. IMPRESSION: No acute or significant extracardiac abnormality. Electronically Signed: By: Charlett Nose M.D. On: 2016-07-21 12:20     Diagnostic Studies/Procedures    Cardiac CT 2016-07-21 IMPRESSION: 1) Calcium score 0 2) Normal right dominant coronary arteries 3) Normal aorta root  measures 2.9 cm   _____________  2D ECHO - final read pending at discharge  Disposition   Pt is being discharged home today in good condition.  Follow-up Plans & Appointments    Follow-up Information    Cline Crock, PA-C. Go on 07/14/2016.   Specialties:  Cardiology, Radiology Why:  @ 8am, please arrive at least 10 minutes.  Contact information: [address] [place] [address] [phone]            Discharge Medications     Medication List    TAKE these medications   alprazolam 2 MG tablet Commonly known as:  XANAX 1/2 tab in am and 1 tab at hs What changed:  how much to take  how to take this  when to take this  reasons to take this  additional instructions   colchicine 0.6 MG tablet  Take 1 tablet (0.6 mg total) by mouth 2 (two) times daily.   metoprolol tartrate 25 MG tablet Commonly known as:  LOPRESSOR Take 0.5 tablets (12.5 mg total) by mouth 2 (two) times daily.   sertraline 100 MG tablet Commonly known as:  ZOLOFT Take 1 tablet (100 mg total) by mouth daily.       Outstanding Labs/Studies   none  Duration of Discharge Encounter   Greater than 30 minutes including physician time.  Signed, Cline CrockKathryn Mae Denunzio PA-C 07/05/2016, 3:07 PM

## 2016-07-05 NOTE — Progress Notes (Signed)
  Echocardiogram 2D Echocardiogram has been performed.  Delcie RochENNINGTON, Jaia Alonge 07/05/2016, 2:20 PM

## 2016-07-12 NOTE — Progress Notes (Addendum)
Cardiology Office Note    Date:  07/14/2016   ID:  Autumn Dixon, DOB 1975-12-09, MRN [id number]  PCP:  Elvina Sidle, MD  Cardiologist: Dr. Tresa Endo saw in hospital (she wants to see Dr. Anne Fu)   CC: post hospital follow up  History of Present Illness:  Autumn Dixon is a 41 y.o. female with a history of fibromyalgia and premature family history of CAD ( father had MI at 24) who presents to clinic for post hospital follow up.   She presented to [facility] on 07/04/16 with chest pain x5 days that was intermittent and mostly positional (wore supine and better leaning forward). ECG mildly abnormal with PVC and TWI in v3. Troponin normal. No rub on exam. Dr. Gala Romney felt like this was mostlikely percarditis. No evidence of SCAD. Treatedwith toradol and colchicine. Cardiac CTA showed normal coronary arteries with a calcium score of 0. 2D ECHO showed no pericardial effusion and Sed rate normal. She was discharged home on colchicine 0.6mg  BID and ibuprofen 600mg  TID x 1 week.  Today she presents to clinic for follow up. She said the colchicine has been super helpful for her chest pain and also other body aches and pain. She ran out of colchicine yesterday and started to have return of her chest pain. No SOB. No LE edema, orthopnea or PND. No dizziness or syncope. No blood in stool or urine. Has has some palpitations that are not as intense as before she started the metoprolol.      Past Medical History:  Diagnosis Date  . Anxiety   . Depression   . Family history of premature CAD 07/04/2016  . Fibromyalgia     Past Surgical History:  Procedure Laterality Date  . GANGLION CYST EXCISION Right    wrist  . KNEE ARTHROSCOPY Right   . SHOULDER ARTHROSCOPY Right     Current Medications: Outpatient Medications Prior to Visit  Medication Sig Dispense Refill  . metoprolol tartrate (LOPRESSOR) 25 MG tablet Take 0.5 tablets (12.5 mg total) by mouth 2 (two) times daily. 60 tablet 3  . sertraline  (ZOLOFT) 100 MG tablet Take 1 tablet (100 mg total) by mouth daily. 90 tablet 3  . colchicine 0.6 MG tablet Take 1 tablet (0.6 mg total) by mouth 2 (two) times daily. 14 tablet 0  . alprazolam (XANAX) 2 MG tablet 1/2 tab in am and 1 tab at hs (Patient not taking: Reported on 07/14/2016) 180 tablet 5   No facility-administered medications prior to visit.      Allergies:   Patient has no known allergies.   Social History   Social History  . Marital status: Married    Spouse name: N/A  . Number of children: 4  . Years of education: N/A   Occupational History  . secretary/school treasurer    Social History Main Topics  . Smoking status: Former Smoker    Years: 1.00    Types: Cigarettes    Quit date: 05/10/1997  . Smokeless tobacco: Never Used  . Alcohol use 0.6 oz/week    1 Glasses of wine per week     Comment: occasional  . Drug use: No  . Sexual activity: Not Asked   Other Topics Concern  . None   Social History Narrative  . None     Family History:  The patient's family history includes Colitis in her maternal grandmother; Colon cancer in her paternal grandfather; Colon polyps in her maternal grandmother and mother; Diabetes in her  brother, maternal grandfather, and maternal grandmother; Heart attack in her paternal aunt and paternal uncle; Heart disease in her paternal aunt and paternal grandfather; Heart disease (age of onset: 3) in her father; Irritable bowel syndrome in her maternal grandmother; Ovarian cysts in her mother and sister; Pancreatic cancer in her maternal grandfather.      ROS:   Please see the history of present illness.    ROS All other systems reviewed and are negative.   PHYSICAL EXAM:   VS:  BP 100/70   Pulse 91   Ht 5\' 11"  (1.803 m)   Wt 179 lb 12.8 oz (81.6 kg)   SpO2 99%   BMI 25.08 kg/m    GEN: Well nourished, well developed, in no acute distress  HEENT: normal  Neck: no JVD, carotid bruits, or masses Cardiac: RRR; no murmurs, rubs, or  gallops,no edema  Respiratory:  clear to auscultation bilaterally, normal work of breathing GI: soft, nontender, nondistended, + BS MS: no deformity or atrophy  Skin: warm and dry, no rash Neuro:  Alert and Oriented x 3, Strength and sensation are intact Psych: euthymic mood, full affect    Wt Readings from Last 3 Encounters:  07/14/16 179 lb 12.8 oz (81.6 kg)  07/05/16 184 lb (83.5 kg)  12/04/15 184 lb (83.5 kg)      Studies/Labs Reviewed:   EKG:  EKG is NOT ordered today.    Recent Labs: 07/04/2016: ALT 35; Hemoglobin 12.2; Magnesium 1.8; Platelets 297; TSH 1.418 07/05/2016: BUN 7; Creatinine, Ser 0.52; Potassium 4.2; Sodium 139   Lipid Panel    Component Value Date/Time   CHOL 136 07/05/2016 0423   TRIG 44 07/05/2016 0423   HDL 59 07/05/2016 0423   CHOLHDL 2.3 07/05/2016 0423   VLDL 9 07/05/2016 0423   LDLCALC 68 07/05/2016 0423    Additional studies/ records that were reviewed today include:    Cardiac CT 07/05/16 IMPRESSION: 1) Calcium score 0 2) Normal right dominant coronary arteries 3) Normal aorta root measures 2.9 cm   2D ECHO: 07/05/2016 LV EF: 55% Study Conclusions - Left ventricle: The cavity size was normal. Systolic function was   normal. The estimated ejection fraction was 55%. Wall motion was   normal; there were no regional wall motion abnormalities. Left   ventricular diastolic function parameters were normal. - Atrial septum: No defect or patent foramen ovale was identified.  ASSESSMENT & PLAN:   Chest pain: most c/w pericarditis. Cardiac CT with calcium score of 0 and normal cors/aorta. Chest pain improved with colchicine. Okay to continue treatment for 3 months. Will have her follow up with Dr. Tresa Endo in 3 months and if she is doing okay, she can follow with primary care  PVCs: continue BB, which has helped lessen her palpitations  Family history of early CAD: continue current healthy lifestyle. Cardiac CT normal  Fibromyalgia: colchicine  has seemed to help with this as well.    Medication Adjustments/Labs and Tests Ordered: Current medicines are reviewed at length with the patient today.  Concerns regarding medicines are outlined above.  Medication changes, Labs and Tests ordered today are listed in the Patient Instructions below. Patient Instructions  Medication Instructions:  Your physician recommends that you continue on your current medications as directed. Please refer to the Current Medication list given to you today.   Labwork: None ordered  Testing/Procedures: None ordered  Follow-Up: Your physician recommends that you schedule a follow-up appointment in: 3 MONTHS WITH DR. Tresa Endo   Any  Other Special Instructions Will Be Listed Below (If Applicable).   If you need a refill on your cardiac medications before your next appointment, please call your pharmacy.      Signed, Cline CrockKathryn Dvante Hands, PA-C  07/14/2016 8:41 AM    [facility] [address], [place], [place]  [address] Phone: [phone]; Fax: [phone]

## 2016-07-14 ENCOUNTER — Ambulatory Visit (INDEPENDENT_AMBULATORY_CARE_PROVIDER_SITE_OTHER): Payer: BC Managed Care – PPO | Admitting: Physician Assistant

## 2016-07-14 ENCOUNTER — Encounter: Payer: Self-pay | Admitting: Physician Assistant

## 2016-07-14 VITALS — BP 100/70 | HR 91 | Ht 71.0 in | Wt 179.8 lb

## 2016-07-14 DIAGNOSIS — I319 Disease of pericardium, unspecified: Secondary | ICD-10-CM

## 2016-07-14 DIAGNOSIS — M797 Fibromyalgia: Secondary | ICD-10-CM

## 2016-07-14 DIAGNOSIS — Z8249 Family history of ischemic heart disease and other diseases of the circulatory system: Secondary | ICD-10-CM

## 2016-07-14 DIAGNOSIS — I493 Ventricular premature depolarization: Secondary | ICD-10-CM | POA: Diagnosis not present

## 2016-07-14 MED ORDER — COLCHICINE 0.6 MG PO TABS
0.6000 mg | ORAL_TABLET | Freq: Two times a day (BID) | ORAL | 0 refills | Status: DC
Start: 2016-07-14 — End: 2016-09-13

## 2016-07-14 NOTE — Patient Instructions (Addendum)
Medication Instructions:  Your physician recommends that you continue on your current medications as directed. Please refer to the Current Medication list given to you today.   Labwork: None ordered  Testing/Procedures: None ordered  Follow-Up: Your physician recommends that you schedule a follow-up appointment in: 3 MONTHS WITH DR. Tresa EndoKELLY   Any Other Special Instructions Will Be Listed Below (If Applicable).   If you need a refill on your cardiac medications before your next appointment, please call your pharmacy.

## 2016-08-22 ENCOUNTER — Observation Stay (HOSPITAL_COMMUNITY)
Admission: EM | Admit: 2016-08-22 | Discharge: 2016-08-26 | Disposition: A | Discharge status: Home / Self Care | Payer: BC Managed Care – PPO | Attending: Internal Medicine | Admitting: Internal Medicine

## 2016-08-22 ENCOUNTER — Encounter (HOSPITAL_COMMUNITY): Payer: Self-pay | Admitting: Emergency Medicine

## 2016-08-22 DIAGNOSIS — I10 Essential (primary) hypertension: Secondary | ICD-10-CM | POA: Insufficient documentation

## 2016-08-22 DIAGNOSIS — M797 Fibromyalgia: Secondary | ICD-10-CM | POA: Diagnosis not present

## 2016-08-22 DIAGNOSIS — R002 Palpitations: Secondary | ICD-10-CM | POA: Diagnosis not present

## 2016-08-22 DIAGNOSIS — F329 Major depressive disorder, single episode, unspecified: Secondary | ICD-10-CM | POA: Diagnosis not present

## 2016-08-22 DIAGNOSIS — K8044 Calculus of bile duct with chronic cholecystitis without obstruction: Secondary | ICD-10-CM | POA: Diagnosis not present

## 2016-08-22 DIAGNOSIS — F411 Generalized anxiety disorder: Secondary | ICD-10-CM | POA: Diagnosis present

## 2016-08-22 DIAGNOSIS — D649 Anemia, unspecified: Secondary | ICD-10-CM | POA: Diagnosis not present

## 2016-08-22 DIAGNOSIS — G8929 Other chronic pain: Secondary | ICD-10-CM | POA: Diagnosis not present

## 2016-08-22 DIAGNOSIS — K828 Other specified diseases of gallbladder: Secondary | ICD-10-CM | POA: Diagnosis not present

## 2016-08-22 DIAGNOSIS — R1013 Epigastric pain: Secondary | ICD-10-CM

## 2016-08-22 DIAGNOSIS — K8070 Calculus of gallbladder and bile duct without cholecystitis without obstruction: Secondary | ICD-10-CM | POA: Diagnosis not present

## 2016-08-22 DIAGNOSIS — Z87891 Personal history of nicotine dependence: Secondary | ICD-10-CM | POA: Insufficient documentation

## 2016-08-22 DIAGNOSIS — F419 Anxiety disorder, unspecified: Secondary | ICD-10-CM | POA: Diagnosis not present

## 2016-08-22 DIAGNOSIS — R7989 Other specified abnormal findings of blood chemistry: Secondary | ICD-10-CM

## 2016-08-22 DIAGNOSIS — K8051 Calculus of bile duct without cholangitis or cholecystitis with obstruction: Secondary | ICD-10-CM | POA: Diagnosis present

## 2016-08-22 DIAGNOSIS — G4700 Insomnia, unspecified: Secondary | ICD-10-CM | POA: Diagnosis not present

## 2016-08-22 DIAGNOSIS — K5900 Constipation, unspecified: Secondary | ICD-10-CM | POA: Diagnosis not present

## 2016-08-22 DIAGNOSIS — K8050 Calculus of bile duct without cholangitis or cholecystitis without obstruction: Secondary | ICD-10-CM | POA: Diagnosis present

## 2016-08-22 DIAGNOSIS — R740 Nonspecific elevation of levels of transaminase and lactic acid dehydrogenase [LDH]: Secondary | ICD-10-CM | POA: Insufficient documentation

## 2016-08-22 DIAGNOSIS — E876 Hypokalemia: Secondary | ICD-10-CM | POA: Diagnosis not present

## 2016-08-22 DIAGNOSIS — I493 Ventricular premature depolarization: Secondary | ICD-10-CM | POA: Diagnosis not present

## 2016-08-22 DIAGNOSIS — R945 Abnormal results of liver function studies: Secondary | ICD-10-CM

## 2016-08-22 HISTORY — DX: Cholecystitis, unspecified: K81.9

## 2016-08-22 LAB — URINALYSIS, ROUTINE W REFLEX MICROSCOPIC
Bacteria, UA: NONE SEEN
Bilirubin Urine: NEGATIVE
Glucose, UA: NEGATIVE mg/dL
Ketones, ur: NEGATIVE mg/dL
Leukocytes, UA: NEGATIVE
Nitrite: NEGATIVE
Protein, ur: NEGATIVE mg/dL
Specific Gravity, Urine: 1.002 — ABNORMAL LOW (ref 1.005–1.030)
pH: 6 (ref 5.0–8.0)

## 2016-08-22 LAB — COMPREHENSIVE METABOLIC PANEL
ALT: 200 U/L — ABNORMAL HIGH (ref 14–54)
AST: 112 U/L — ABNORMAL HIGH (ref 15–41)
Albumin: 3.8 g/dL (ref 3.5–5.0)
Alkaline Phosphatase: 75 U/L (ref 38–126)
Anion gap: 10 (ref 5–15)
BUN: <5 mg/dL — ABNORMAL LOW (ref 6–20)
CO2: 26 mmol/L (ref 22–32)
Calcium: 9 mg/dL (ref 8.9–10.3)
Chloride: 100 mmol/L — ABNORMAL LOW (ref 101–111)
Creatinine, Ser: 0.61 mg/dL (ref 0.44–1.00)
GFR calc Af Amer: >60 mL/min (ref >60)
GFR calc non Af Amer: >60 mL/min (ref >60)
Glucose, Bld: 120 mg/dL — ABNORMAL HIGH (ref 65–99)
Potassium: 3.4 mmol/L — ABNORMAL LOW (ref 3.5–5.1)
Sodium: 136 mmol/L (ref 135–145)
Total Bilirubin: 1.2 mg/dL (ref 0.3–1.2)
Total Protein: 6 g/dL — ABNORMAL LOW (ref 6.5–8.1)

## 2016-08-22 LAB — CBC
HCT: 35.5 % — ABNORMAL LOW (ref 36.0–46.0)
Hemoglobin: 11.7 g/dL — ABNORMAL LOW (ref 12.0–15.0)
MCH: 30.4 pg (ref 26.0–34.0)
MCHC: 33 g/dL (ref 30.0–36.0)
MCV: 92.2 fL (ref 78.0–100.0)
Platelets: 281 10*3/uL (ref 150–400)
RBC: 3.85 MIL/uL — ABNORMAL LOW (ref 3.87–5.11)
RDW: 12.6 % (ref 11.5–15.5)
WBC: 5 10*3/uL (ref 4.0–10.5)

## 2016-08-22 LAB — LIPASE, BLOOD: Lipase: 27 U/L (ref 11–51)

## 2016-08-22 LAB — I-STAT BETA HCG BLOOD, ED (MC, WL, AP ONLY): I-stat hCG, quantitative: <5 m[IU]/mL (ref <5)

## 2016-08-22 MED ORDER — SODIUM CHLORIDE 0.9 % IV SOLN
INTRAVENOUS | Status: DC
  Administered 2016-08-22: via INTRAVENOUS

## 2016-08-22 MED ORDER — ENOXAPARIN SODIUM 40 MG/0.4ML ~~LOC~~ SOLN: 40.0000 mg | SUBCUTANEOUS | Status: DC

## 2016-08-22 MED ORDER — ACETAMINOPHEN 325 MG PO TABS
650.0000 mg | ORAL_TABLET | Freq: Four times a day (QID) | ORAL | Status: DC | PRN
  Administered 2016-08-22 – 2016-08-26 (×7): 650 mg via ORAL
  Filled 2016-08-22 (×7): qty 2

## 2016-08-22 MED ORDER — FENTANYL CITRATE (PF) 100 MCG/2ML IJ SOLN: 25.0000 ug | INTRAMUSCULAR | Status: DC | PRN

## 2016-08-22 MED ORDER — ACETAMINOPHEN 650 MG RE SUPP: 650.0000 mg | Freq: Four times a day (QID) | RECTAL | Status: DC | PRN

## 2016-08-22 MED ORDER — ALPRAZOLAM 0.5 MG PO TABS: 1.0000 mg | ORAL_TABLET | Freq: Every evening | ORAL | Status: DC | PRN

## 2016-08-22 MED ORDER — SODIUM CHLORIDE 0.9 % IV BOLUS (SEPSIS)
1000.0000 mL | Freq: Once | INTRAVENOUS | Status: AC
  Administered 2016-08-22: 1000 mL via INTRAVENOUS

## 2016-08-22 MED ORDER — POTASSIUM CHLORIDE CRYS ER 20 MEQ PO TBCR
30.0000 meq | EXTENDED_RELEASE_TABLET | Freq: Once | ORAL | Status: AC
  Administered 2016-08-22: 30 meq via ORAL
  Filled 2016-08-22: qty 1

## 2016-08-22 MED ORDER — METOPROLOL TARTRATE 12.5 MG HALF TABLET
12.5000 mg | ORAL_TABLET | Freq: Two times a day (BID) | ORAL | Status: DC
  Administered 2016-08-23 – 2016-08-26 (×7): 12.5 mg via ORAL
  Filled 2016-08-22 (×7): qty 1

## 2016-08-22 MED ORDER — ONDANSETRON HCL 4 MG/2ML IJ SOLN: 4.0000 mg | Freq: Four times a day (QID) | INTRAMUSCULAR | Status: AC | PRN

## 2016-08-22 NOTE — H&P (Addendum)
History and Physical    Autumn Dixon ZOX:[id number] DOB: June 29, 1975 DOA: 08/22/2016  PCP: Elvina Sidle, MD   Patient coming from: Home  Chief Complaint: Epigastric pain   HPI: Autumn Dixon is a 41 y.o. female with medical history significant for anxiety, hypertension, and fibromyalgia who presents the emergency department for evaluation of episodic epigastric pain. Patient reports that she had been in her usual state of health until 08/11/2016 when, shortly after a meal, she developed acute onset of severe, sharp, cramping pain localized to the epigastrium and associated with mild nausea. She reports that the episode resolved after 20-30 minutes, but she had a another similar episode later that day. Since that time, patient reports having 5 severe episodes, mainly postprandial, and all lasting approximately 20-30 minutes. She is also had some similar episodes that have been milder and resolve quicker. She denies any recent fevers or chills and denies any vomiting or diarrhea. There has not been any chest pain, and no dyspnea or cough. Patient denies any easy bruising or bleeding or yellowing of the skin or eyes. In between the episodes, she is in her normal state and generally well. She was evaluated at an outside hospital for these complaints yesterday and had an ultrasound of right upper quadrant performed, revealing cholelithiasis and choledocholithiasis. She was advised that she would likely require surgery, but was out of town and elected to come back home to [place] to be evaluated here.  ED Course: Upon arrival to the ED, patient is found to be afebrile, saturating well on room air, and with vital signs stable. Chemistry panel features a potassium of 3.4, AST of 112, ALT 200, and normal bilirubin and lipase. CBC is notable for a mild normocytic anemia with hemoglobin of 11.7. Patient was given a liter of normal saline and gastroenterology was consulted by the ED physician. GI advised for a  medical admission with patient to be nothing by mouth after midnight for likely ERCP in the morning. Patient remained hemodynamically stable in the emergency department and has not been in any apparent respiratory distress. She will be admitted to the medical-surgical unit for ongoing evaluation and management of choledocholithiasis with biliary colic.  Review of Systems:  All other systems reviewed and apart from HPI, are negative.  Past Medical History:  Diagnosis Date  . Anxiety   . Depression   . Family history of premature CAD 07/04/2016  . Fibromyalgia     Past Surgical History:  Procedure Laterality Date  . GANGLION CYST EXCISION Right    wrist  . KNEE ARTHROSCOPY Right   . SHOULDER ARTHROSCOPY Right      reports that she quit smoking about 19 years ago. Her smoking use included Cigarettes. She quit after 1.00 year of use. She has never used smokeless tobacco. She reports that she drinks about 0.6 oz of alcohol per week . She reports that she does not use drugs.  No Known Allergies  Family History  Problem Relation Age of Onset  . Colon polyps Mother   . Ovarian cysts Mother   . Heart disease Father 11  . Diabetes Maternal Grandmother   . Colon polyps Maternal Grandmother   . Colitis Maternal Grandmother     ?  Marland Kitchen Irritable bowel syndrome Maternal Grandmother   . Pancreatic cancer Maternal Grandfather   . Diabetes Maternal Grandfather   . Colon cancer Paternal Grandfather   . Heart disease Paternal Grandfather   . Diabetes Brother   . Ovarian cysts  Sister   . Heart attack Paternal Aunt     died 16s with mi  . Heart disease Paternal Aunt   . Heart attack Paternal Uncle     died in early 5 s MI      Prior to Admission medications   Medication Sig Start Date End Date Taking? Authorizing Provider  alprazolam Prudy Feeler) 2 MG tablet Take 2 mg by mouth at bedtime as needed for sleep or anxiety.   Yes Historical Provider, MD  colchicine 0.6 MG tablet Take 1 tablet (0.6  mg total) by mouth 2 (two) times daily. 07/14/16  Yes Janetta Hora, PA-C  ibuprofen (ADVIL,MOTRIN) 200 MG tablet Take 800 mg by mouth every 6 (six) hours as needed for headache or moderate pain.   Yes Historical Provider, MD  metoprolol tartrate (LOPRESSOR) 25 MG tablet Take 0.5 tablets (12.5 mg total) by mouth 2 (two) times daily. 07/05/16  Yes Janetta Hora, PA-C  sertraline (ZOLOFT) 100 MG tablet Take 1 tablet (100 mg total) by mouth daily. Patient not taking: Reported on 08/22/2016 07/15/15   Elvina Sidle, MD    Physical Exam: Vitals:   08/22/16 2000 08/22/16 2015 08/22/16 2030 08/22/16 2045  BP: 116/81 111/68 104/78 102/76  Pulse: 72 76 79 71  Resp: 18   18  Temp:      TempSrc:      SpO2: 100% 100% 100% 100%  Weight:      Height:          Constitutional: NAD, calm, comfortable Eyes: PERTLA, lids and conjunctivae normal ENMT: Mucous membranes are moist. Posterior pharynx clear of any exudate or lesions.   Neck: normal, supple, no masses, no thyromegaly Respiratory: clear to auscultation bilaterally, no wheezing, no crackles. Normal respiratory effort.   Cardiovascular: S1 & S2 heard, regular rate and rhythm. No extremity edema. 2+ pedal pulses. No significant JVD. Abdomen: No distension, no tenderness, no masses palpated. Bowel sounds normal.  Musculoskeletal: no clubbing / cyanosis. No joint deformity upper and lower extremities.   Skin: no significant rashes, lesions, ulcers. Warm, dry, well-perfused. Neurologic: CN 2-12 grossly intact. Sensation intact, DTR normal. Strength 5/5 in all 4 limbs.  Psychiatric: Alert and oriented x 3. Normal mood and affect.     Labs on Admission: I have personally reviewed following labs and imaging studies  CBC:  Recent Labs Lab 08/22/16 1800  WBC 5.0  HGB 11.7*  HCT 35.5*  MCV 92.2  PLT 281   Basic Metabolic Panel:  Recent Labs Lab 08/22/16 1800  NA 136  K 3.4*  CL 100*  CO2 26  GLUCOSE 120*  BUN <5*    CREATININE 0.61  CALCIUM 9.0   GFR: Estimated Creatinine Clearance: 103.1 mL/min (by C-G formula based on SCr of 0.61 mg/dL). Liver Function Tests:  Recent Labs Lab 08/22/16 1800  AST 112*  ALT 200*  ALKPHOS 75  BILITOT 1.2  PROT 6.0*  ALBUMIN 3.8    Recent Labs Lab 08/22/16 1800  LIPASE 27   No results for input(s): AMMONIA in the last 168 hours. Coagulation Profile: No results for input(s): INR, PROTIME in the last 168 hours. Cardiac Enzymes: No results for input(s): CKTOTAL, CKMB, CKMBINDEX, TROPONINI in the last 168 hours. BNP (last 3 results) No results for input(s): PROBNP in the last 8760 hours. HbA1C: No results for input(s): HGBA1C in the last 72 hours. CBG: No results for input(s): GLUCAP in the last 168 hours. Lipid Profile: No results for input(s): CHOL, HDL, LDLCALC,  TRIG, CHOLHDL, LDLDIRECT in the last 72 hours. Thyroid Function Tests: No results for input(s): TSH, T4TOTAL, FREET4, T3FREE, THYROIDAB in the last 72 hours. Anemia Panel: No results for input(s): VITAMINB12, FOLATE, FERRITIN, TIBC, IRON, RETICCTPCT in the last 72 hours. Urine analysis:    Component Value Date/Time   BILIRUBINUR negative 07/15/2015 1127   BILIRUBINUR neg 01/30/2014 1121   KETONESUR negative 07/15/2015 1127   PROTEINUR negative 07/15/2015 1127   PROTEINUR neg 01/30/2014 1121   UROBILINOGEN 0.2 07/15/2015 1127   NITRITE Negative 07/15/2015 1127   NITRITE neg 01/30/2014 1121   LEUKOCYTESUR Trace (A) 07/15/2015 1127   Sepsis Labs: (procalcitonin:4,lacticidven:4) )No results found for this or any previous visit (from the past 240 hour(s)).   Radiological Exams on Admission: No results found.  EKG: Not performed, will obtain as appropriate.   Assessment/Plan  1. Choledocholithiasis, biliary colic   - Pt presents with 10 days of episodic, severe postprandial epigastric pain lasting 20-30 mins each  - She has not been febrile, there is no leukocytosis,  and she is non-toxic in appearance  - Mild elevations in transaminases noted, bilirubin and lipase wnl  - She has [place] report from outside hospital (and brings disk with images), reporting cholelithiasis with choledocholithiasis - GI is consulting and much appreciated; plan to keep NPO, provide IVF hydration and prn analgesia and antiemetics - Likely ERCP in am   2. Anxiety disorder  - Appears stable  - Continue prn Xanax   3. Palpitations - Evaluated by cardiology in Feb '18, attributed to PVC's   - Improved with Lopressor, will continue   4. Hypokalemia  - Serum potassium 3.4 on admission and she was given 30 mEq oral supplement  - Repeat chemistries in am    DVT prophylaxis: sq Lovenox Code Status: Full  Family Communication: Discussed with patient Disposition Plan: Admit to med-surg Consults called: Gastroenterology Admission status: Inpatient    Briscoe Deutscher, MD [facility] Pager [phone]  If 7PM-7AM, please contact night-coverage [email or web address] Password [email or web address]  08/22/2016, 9:08 PM

## 2016-08-22 NOTE — ED Provider Notes (Signed)
[facility] DEPT Provider Note   CSN: [id number] Arrival date & time: 08/22/16  1734     History   Chief Complaint Chief Complaint  Patient presents with  . Abdominal Pain    HPI Autumn Dixon is a 41 y.o. female.  HPI   Autumn Dixon is a 41 y.o. female, with a history of Anxiety, depression, and fibromyalgia, presenting to the ED with epigastric pain beginning last week. Lasted 25-30 minutes. Has had 5 separate instances of pain. Was seen at an UC yesterday and then ED in [facility]. Diagnosed with cholelith and choledoco. Signed out AMA so she could come to [facility]. States she is here for surgery.  Patient states the pain is like a labor contraction that never lets up, rates it 10/10. Initially occurred after eating, but then also occurred when not eating. Endorses some nausea. Denies previous abdominal surgeries. Denies fever/chills, vomiting/diarrhea, or any other complaints.   Last food around 6 pm this evening. Previous contact with gastroenterologist, Dr. Hilarie Fredrickson, for hemorrhoids.  Past Medical History:  Diagnosis Date  . Anxiety   . Depression   . Family history of premature CAD 07/04/2016  . Fibromyalgia     Patient Active Problem List   Diagnosis Date Noted  . Fibromyalgia   . Pericarditis   . Chest pain at rest 07/04/2016  . Family history of premature CAD 07/04/2016  . Anxiety state 12/24/2013  . Contraception 12/24/2013    Past Surgical History:  Procedure Laterality Date  . GANGLION CYST EXCISION Right    wrist  . KNEE ARTHROSCOPY Right   . SHOULDER ARTHROSCOPY Right     OB History    No data available       Home Medications    Prior to Admission medications   Medication Sig Start Date End Date Taking? Authorizing Provider  alprazolam Duanne Moron) 2 MG tablet Take 2 mg by mouth at bedtime as needed for sleep or anxiety.   Yes Historical Provider, MD  colchicine 0.6 MG tablet Take 1 tablet (0.6 mg total) by mouth 2 (two) times daily. 07/14/16   Yes Eileen Stanford, PA-C  ibuprofen (ADVIL,MOTRIN) 200 MG tablet Take 800 mg by mouth every 6 (six) hours as needed for headache or moderate pain.   Yes Historical Provider, MD  metoprolol tartrate (LOPRESSOR) 25 MG tablet Take 0.5 tablets (12.5 mg total) by mouth 2 (two) times daily. 07/05/16  Yes Eileen Stanford, PA-C  sertraline (ZOLOFT) 100 MG tablet Take 1 tablet (100 mg total) by mouth daily. Patient not taking: Reported on 08/22/2016 07/15/15   Robyn Haber, MD    Family History Family History  Problem Relation Age of Onset  . Colon polyps Mother   . Ovarian cysts Mother   . Heart disease Father 65  . Diabetes Maternal Grandmother   . Colon polyps Maternal Grandmother   . Colitis Maternal Grandmother     ?  Marland Kitchen Irritable bowel syndrome Maternal Grandmother   . Pancreatic cancer Maternal Grandfather   . Diabetes Maternal Grandfather   . Colon cancer Paternal Grandfather   . Heart disease Paternal Grandfather   . Diabetes Brother   . Ovarian cysts Sister   . Heart attack Paternal Aunt     died 5s with mi  . Heart disease Paternal Aunt   . Heart attack Paternal Uncle     died in early 26 s MI     Social History Social History  Substance Use Topics  . Smoking  status: Former Smoker    Years: 1.00    Types: Cigarettes    Quit date: 05/10/1997  . Smokeless tobacco: Never Used  . Alcohol use 0.6 oz/week    1 Glasses of wine per week     Comment: occasional     Allergies   Patient has no known allergies.   Review of Systems Review of Systems  Constitutional: Negative for chills, diaphoresis and fever.  Respiratory: Negative for shortness of breath.   Cardiovascular: Negative for chest pain.  Gastrointestinal: Positive for abdominal pain and nausea. Negative for diarrhea and vomiting.  Genitourinary: Negative for dysuria and frequency.  Musculoskeletal: Negative for back pain.  All other systems reviewed and are negative.    Physical Exam Updated Vital  Signs BP 121/83 (BP Location: Right Arm)   Pulse 83   Temp 98.4 F (36.9 C) (Oral)   Resp 18   Ht 5\' 10"  (1.778 m)   Wt 81.6 kg   SpO2 98%   BMI 25.83 kg/m   Physical Exam  Constitutional: She appears well-developed and well-nourished. No distress.  HENT:  Head: Normocephalic and atraumatic.  Eyes: Conjunctivae are normal.  Neck: Neck supple.  Cardiovascular: Normal rate, regular rhythm, normal heart sounds and intact distal pulses.   Pulmonary/Chest: Effort normal and breath sounds normal. No respiratory distress.  Abdominal: Soft. There is no tenderness. There is no guarding, no tenderness at McBurney's point and negative Murphy's sign.  Musculoskeletal: She exhibits no edema.  Lymphadenopathy:    She has no cervical adenopathy.  Neurological: She is alert.  Skin: Skin is warm and dry. She is not diaphoretic.  Psychiatric: She has a normal mood and affect. Her behavior is normal.  Nursing note and vitals reviewed.    ED Treatments / Results  Labs (all labs ordered are listed, but only abnormal results are displayed) Labs Reviewed  COMPREHENSIVE METABOLIC PANEL - Abnormal; Notable for the following:       Result Value   Potassium 3.4 (*)    Chloride 100 (*)    Glucose, Bld 120 (*)    BUN <5 (*)    Total Protein 6.0 (*)    AST 112 (*)    ALT 200 (*)    All other components within normal limits  CBC - Abnormal; Notable for the following:    RBC 3.85 (*)    Hemoglobin 11.7 (*)    HCT 35.5 (*)    All other components within normal limits  LIPASE, BLOOD  URINALYSIS, ROUTINE W REFLEX MICROSCOPIC  I-STAT BETA HCG BLOOD, ED ([facility], [facility], [facility] ONLY)   [facility] pertinent results 08/21/16: ALT: 147 AST: 181 Alk Phos: 82   EKG  EKG Interpretation None       Radiology No results found.  [facility] RUQ on 08/21/16: Cholelithiasis and choledocholithiasis with a dilated common bile duct measuring 6.3 mm.   Procedures Procedures (including critical care  time)  Medications Ordered in ED Medications  sodium chloride 0.9 % bolus 1,000 mL (1,000 mLs Intravenous New Bag/Given 08/22/16 2026)     Initial Impression / Assessment and Plan / ED Course  I have reviewed the triage vital signs and the nursing notes.  Pertinent labs & imaging results that were available during my care of the patient were reviewed by me and considered in my medical decision making (see chart for details).  Clinical Course as of Aug 23 2031  Sun Aug 22, 2016  2025 Spoke with Dr. Aug 24, 2016, gastroenterologist, who  states patient should be admitted via medicine for ERCP tomorrow morning. She recommends limiting patient's intake to sips and chips and then NPO after midnight.  [SJ]    Clinical Course User Index [SJ] Lorayne Bender, PA-C    Patient presents with epigastric and right upper quadrant abdominal pain. Evidence of cholelithiasis and choledocholithiasis on outside ultrasound. Patient is well-appearing and has no signs of sepsis.   Findings and plan of care discussed with Brantley Stage, MD. Dr. Oleta Mouse personally evaluated and examined this patient.  Vitals:   08/22/16 1915 08/22/16 1930 08/22/16 1945 08/22/16 2000  BP: 102/75 118/80 107/81 116/81  Pulse: 68 84 89 72  Resp:    18  Temp:      TempSrc:      SpO2: 100% 100% 100% 100%  Weight:      Height:         Final Clinical Impressions(s) / ED Diagnoses   Final diagnoses:  Calculus of bile duct without cholecystitis with obstruction  Choledocholithiasis    New Prescriptions New Prescriptions   No medications on file     Layla Maw 08/22/16 2034    Forde Dandy, MD 08/22/16 2342

## 2016-08-22 NOTE — ED Triage Notes (Signed)
Pt c/o upper abdominal pain x 1 week. Pt seen at [facility] for same and was told she needed to have gallbladder surgery. Pt needed to come back to [place] because this is where she lives.

## 2016-08-23 ENCOUNTER — Encounter (HOSPITAL_COMMUNITY)
Admission: EM | Disposition: A | Discharge status: Home / Self Care | Payer: Self-pay | Source: Home / Self Care | Attending: Emergency Medicine

## 2016-08-23 ENCOUNTER — Inpatient Hospital Stay (HOSPITAL_COMMUNITY): Payer: BC Managed Care – PPO

## 2016-08-23 ENCOUNTER — Inpatient Hospital Stay (HOSPITAL_COMMUNITY): Payer: BC Managed Care – PPO | Admitting: Certified Registered Nurse Anesthetist

## 2016-08-23 ENCOUNTER — Encounter (HOSPITAL_COMMUNITY): Payer: Self-pay | Admitting: General Practice

## 2016-08-23 DIAGNOSIS — K8070 Calculus of gallbladder and bile duct without cholecystitis without obstruction: Secondary | ICD-10-CM | POA: Diagnosis not present

## 2016-08-23 DIAGNOSIS — R1013 Epigastric pain: Secondary | ICD-10-CM

## 2016-08-23 DIAGNOSIS — K8050 Calculus of bile duct without cholangitis or cholecystitis without obstruction: Secondary | ICD-10-CM | POA: Diagnosis present

## 2016-08-23 DIAGNOSIS — G8929 Other chronic pain: Secondary | ICD-10-CM | POA: Diagnosis not present

## 2016-08-23 DIAGNOSIS — R945 Abnormal results of liver function studies: Secondary | ICD-10-CM

## 2016-08-23 DIAGNOSIS — R7989 Other specified abnormal findings of blood chemistry: Secondary | ICD-10-CM | POA: Diagnosis not present

## 2016-08-23 HISTORY — PX: ERCP: SHX5425

## 2016-08-23 LAB — COMPREHENSIVE METABOLIC PANEL
ALT: 274 U/L — ABNORMAL HIGH (ref 14–54)
AST: 189 U/L — ABNORMAL HIGH (ref 15–41)
Albumin: 3.1 g/dL — ABNORMAL LOW (ref 3.5–5.0)
Alkaline Phosphatase: 87 U/L (ref 38–126)
Anion gap: 8 (ref 5–15)
BUN: <5 mg/dL — ABNORMAL LOW (ref 6–20)
CO2: 25 mmol/L (ref 22–32)
Calcium: 8.2 mg/dL — ABNORMAL LOW (ref 8.9–10.3)
Chloride: 107 mmol/L (ref 101–111)
Creatinine, Ser: 0.56 mg/dL (ref 0.44–1.00)
GFR calc Af Amer: >60 mL/min (ref >60)
GFR calc non Af Amer: >60 mL/min (ref >60)
Glucose, Bld: 82 mg/dL (ref 65–99)
Potassium: 3.5 mmol/L (ref 3.5–5.1)
Sodium: 140 mmol/L (ref 135–145)
Total Bilirubin: 1.1 mg/dL (ref 0.3–1.2)
Total Protein: 5 g/dL — ABNORMAL LOW (ref 6.5–8.1)

## 2016-08-23 LAB — CBC WITH DIFFERENTIAL/PLATELET
Basophils Absolute: 0 10*3/uL (ref 0.0–0.1)
Basophils Relative: 0 %
Eosinophils Absolute: 0.2 10*3/uL (ref 0.0–0.7)
Eosinophils Relative: 3 %
HCT: 32.6 % — ABNORMAL LOW (ref 36.0–46.0)
Hemoglobin: 11 g/dL — ABNORMAL LOW (ref 12.0–15.0)
Lymphocytes Relative: 43 %
Lymphs Abs: 2 10*3/uL (ref 0.7–4.0)
MCH: 31.3 pg (ref 26.0–34.0)
MCHC: 33.7 g/dL (ref 30.0–36.0)
MCV: 92.9 fL (ref 78.0–100.0)
Monocytes Absolute: 0.3 10*3/uL (ref 0.1–1.0)
Monocytes Relative: 7 %
Neutro Abs: 2.3 10*3/uL (ref 1.7–7.7)
Neutrophils Relative %: 47 %
Platelets: 234 10*3/uL (ref 150–400)
RBC: 3.51 MIL/uL — ABNORMAL LOW (ref 3.87–5.11)
RDW: 12.8 % (ref 11.5–15.5)
WBC: 4.8 10*3/uL (ref 4.0–10.5)

## 2016-08-23 LAB — PROTIME-INR
INR: 1.17
Prothrombin Time: 14.9 seconds (ref 11.4–15.2)

## 2016-08-23 LAB — SURGICAL PCR SCREEN
MRSA, PCR: NEGATIVE
Staphylococcus aureus: NEGATIVE

## 2016-08-23 LAB — LIPASE, BLOOD: Lipase: 23 U/L (ref 11–51)

## 2016-08-23 LAB — MAGNESIUM: Magnesium: 1.8 mg/dL (ref 1.7–2.4)

## 2016-08-23 LAB — HIV ANTIBODY (ROUTINE TESTING W REFLEX): HIV Screen 4th Generation wRfx: NONREACTIVE

## 2016-08-23 SURGERY — ERCP, WITH INTERVENTION IF INDICATED
Anesthesia: General

## 2016-08-23 MED ORDER — ROCURONIUM BROMIDE 100 MG/10ML IV SOLN
INTRAVENOUS | Status: DC | PRN
  Administered 2016-08-23: 35 mg via INTRAVENOUS

## 2016-08-23 MED ORDER — SUGAMMADEX SODIUM 200 MG/2ML IV SOLN
INTRAVENOUS | Status: DC | PRN
  Administered 2016-08-23: 200 mg via INTRAVENOUS

## 2016-08-23 MED ORDER — IOPAMIDOL (ISOVUE-300) INJECTION 61%
INTRAVENOUS | Status: DC | PRN
  Administered 2016-08-23: 80 mL

## 2016-08-23 MED ORDER — CIPROFLOXACIN IN D5W 400 MG/200ML IV SOLN
400.0000 mg | Freq: Once | INTRAVENOUS | Status: AC
  Administered 2016-08-23: 400 mg via INTRAVENOUS

## 2016-08-23 MED ORDER — INDOMETHACIN 50 MG RE SUPP
RECTAL | Status: AC
  Filled 2016-08-23: qty 2

## 2016-08-23 MED ORDER — CIPROFLOXACIN IN D5W 400 MG/200ML IV SOLN
INTRAVENOUS | Status: AC
  Filled 2016-08-23: qty 200

## 2016-08-23 MED ORDER — MORPHINE SULFATE (PF) 2 MG/ML IV SOLN
2.0000 mg | INTRAVENOUS | Status: DC | PRN
  Administered 2016-08-24: 2 mg via INTRAVENOUS
  Filled 2016-08-23: qty 1

## 2016-08-23 MED ORDER — FENTANYL CITRATE (PF) 100 MCG/2ML IJ SOLN
INTRAMUSCULAR | Status: DC | PRN
  Administered 2016-08-23 (×2): 50 ug via INTRAVENOUS

## 2016-08-23 MED ORDER — SODIUM CHLORIDE 0.9 % IV SOLN
INTRAVENOUS | Status: DC
  Administered 2016-08-23: 14:00:00 via INTRAVENOUS

## 2016-08-23 MED ORDER — LIDOCAINE HCL (CARDIAC) 20 MG/ML IV SOLN
INTRAVENOUS | Status: DC | PRN
  Administered 2016-08-23: 60 mg via INTRAVENOUS

## 2016-08-23 MED ORDER — ONDANSETRON HCL 4 MG/2ML IJ SOLN
INTRAMUSCULAR | Status: DC | PRN
  Administered 2016-08-23: 4 mg via INTRAVENOUS

## 2016-08-23 MED ORDER — GLUCAGON HCL RDNA (DIAGNOSTIC) 1 MG IJ SOLR
INTRAMUSCULAR | Status: AC
  Filled 2016-08-23: qty 1

## 2016-08-23 MED ORDER — PROPOFOL 10 MG/ML IV BOLUS
INTRAVENOUS | Status: DC | PRN
  Administered 2016-08-23: 170 mg via INTRAVENOUS

## 2016-08-23 MED ORDER — MIDAZOLAM HCL 5 MG/5ML IJ SOLN
INTRAMUSCULAR | Status: DC | PRN
  Administered 2016-08-23: 2 mg via INTRAVENOUS

## 2016-08-23 MED ORDER — COLCHICINE 0.6 MG PO TABS
0.6000 mg | ORAL_TABLET | Freq: Once | ORAL | Status: AC
Start: 2016-08-23 — End: 2016-08-25
  Administered 2016-08-25: 0.6 mg via ORAL
  Filled 2016-08-23: qty 1

## 2016-08-23 MED ORDER — IOPAMIDOL (ISOVUE-300) INJECTION 61%
INTRAVENOUS | Status: AC
  Filled 2016-08-23: qty 50

## 2016-08-23 MED ORDER — INDOMETHACIN 50 MG RE SUPP: 100.0000 mg | Freq: Once | RECTAL | Status: DC

## 2016-08-23 MED ORDER — INDOMETHACIN 50 MG RE SUPP
RECTAL | Status: DC | PRN
  Administered 2016-08-23: 100 mg via RECTAL

## 2016-08-23 NOTE — Interval H&P Note (Signed)
History and Physical Interval Note:  08/23/2016 1:47 PM  Autumn Dixon  has presented today for surgery, with the diagnosis of abdominal pain  The various methods of treatment have been discussed with the patient and family. After consideration of risks, benefits and other options for treatment, the patient has consented to  Procedure(s): ENDOSCOPIC RETROGRADE CHOLANGIOPANCREATOGRAPHY (ERCP) (N/A) as a surgical intervention .  The patient's history has been reviewed, patient examined, no change in status, stable for surgery.  I have reviewed the patient's chart and labs.  Questions were answered to the patient's satisfaction.     Charlie Pitter III

## 2016-08-23 NOTE — Consult Note (Signed)
[facility] Consult Note   History Autumn Dixon MRN # [id number]  Date of Admission: 08/22/2016 Date of Consultation: 08/23/2016 Referring physician: Dr. Glade Lloyd, MD  Reason for Consultation/Chief Complaint: Epigastric pain  Subjective  HPI:  This is a 41 year old woman with no chronic digestive symptoms who began having severe episodic epigastric pain within the last several weeks. He will occur soon after eating, it is very intense in the epigastrium, nonradiating with some associated nausea. During an episode of this 2 days ago, she came to me with [facility] near [place]. She brings a report of her ultrasound and lab work as well as the ultrasound on a CD. She was found to have many gallbladder stones and reportedly choledocholithiasis. That time, her CBC was normal, AST 147, ALT 181, alkaline phosphatase 82 and total bilirubin 0.7.  Advised her to be admitted for further workup. She had to get home with her daughter, so she came back to [facility] yesterday and then immediately to the ED. She was admitted for further workup. Her last episode was yesterday while driving home from the [place], she feels well today. During the episode she has no pain.  There is been no fever, night sweats, weight loss, dysphagia, altered bowel habits or rectal bleeding.  ROS: Musculoskeletal: Arthralgias and myalgias Psychiatric:   Chronic anxiety under control with medicines  All other systems are negative except as noted above in the HPI  Past Medical History Past Medical History:  Diagnosis Date  . Anxiety   . Cholecystitis   . Depression   . Family history of premature CAD 07/04/2016  . Fibromyalgia     Past Surgical History Past Surgical History:  Procedure Laterality Date  . GANGLION CYST EXCISION Right    wrist  . KNEE ARTHROSCOPY Right   . SHOULDER ARTHROSCOPY Right     Family History Family History  Problem Relation Age of Onset  . Colon polyps  Mother   . Ovarian cysts Mother   . Heart disease Father 73  . Diabetes Maternal Grandmother   . Colon polyps Maternal Grandmother   . Colitis Maternal Grandmother     ?  Marland Kitchen Irritable bowel syndrome Maternal Grandmother   . Pancreatic cancer Maternal Grandfather   . Diabetes Maternal Grandfather   . Colon cancer Paternal Grandfather   . Heart disease Paternal Grandfather   . Diabetes Brother   . Ovarian cysts Sister   . Heart attack Paternal Aunt     died 40s with mi  . Heart disease Paternal Aunt   . Heart attack Paternal Uncle     died in early 80 s MI     Social History Social History   Social History  . Marital status: Married    Spouse name: N/A  . Number of children: 4  . Years of education: N/A   Occupational History  . secretary/school treasurer    Social History Main Topics  . Smoking status: Former Smoker    Years: 1.00    Types: Cigarettes    Quit date: 05/10/1997  . Smokeless tobacco: Never Used  . Alcohol use 0.6 oz/week    1 Glasses of wine per week     Comment: occasional  . Drug use: No  . Sexual activity: Not Asked   Other Topics Concern  . None   Social History Narrative  . None  She uncommonly drinks alcohol, is no longer a smoker and does not use any illicit drugs.  Allergies No Known  Allergies  Outpatient Meds Home medications from the H+P and/or nursing med reconciliation reviewed.  Inpatient med list reviewed  _____________________________________________________________________ Objective   Exam:  Current vital signs  Patient Vitals for the past 8 hrs:  BP Temp Temp src Pulse Resp SpO2  08/23/16 0640 100/61 98.3 F (36.8 C) Oral 97 18 100 %   No intake or output data in the 24 hours ending 08/23/16 0953  Physical Exam:    General: this is a Well-appearing female patient in no acute distress  Eyes: sclera anicteric, no redness  ENT: oral mucosa moist without lesions, no cervical or supraclavicular lymphadenopathy,  good dentition  CV: RRR without murmur, S1/S2, no JVD,, no peripheral edema  Resp: clear to auscultation bilaterally, normal RR and effort noted  GI: soft, no tenderness, with active bowel sounds. No guarding or palpable organomegaly noted  Skin; warm and dry, no rash or jaundice noted  Neuro: awake, alert and oriented x 3. Normal gross motor function and fluent speech.  Labs:   Recent Labs Lab 08/22/16 1800 08/23/16 0357  WBC 5.0 4.8  HGB 11.7* 11.0*  HCT 35.5* 32.6*  PLT 281 234    Recent Labs Lab 08/23/16 0357  NA 140  K 3.5  CL 107  CO2 25  BUN <5*  ALBUMIN 3.1*  ALKPHOS 87  ALT 274*  AST 189*  GLUCOSE 82    Recent Labs Lab 08/23/16 0357  INR 1.17    Radiologic studies: Ultrasound shows numerous gallstones, which also limits the evaluation of wall thickening. Choledocholithiasis is reportedly found , with a CBD of 6.3 mm No intrahepatic biliary ductal dilatation was reported. I reviewed the procedure report, but was unable to review images on the disc.  @ Impression:  Epigastric pain Cholelithiasis Choledocholithiasis Elevated LFTs  Although ultrasound lacks sensitivity and specificity for finding stones in the common bile duct, the elevated LFTs in this clinical setting lead to a high suspicion that the imaging findings are accurate.   Plan:  ERCP today. The procedure was discussed in great detail and a diagram of the anatomy was given to the patient during detailed description of the procedure, its benefits and risks.   The benefits and risks of the planned procedure were described in detail with the patient or (when appropriate) their health care proxy.  Risks were outlined as including, but not limited to, bleeding, infection, perforation, adverse medication reaction leading to cardiac or pulmonary decompensation, or pancreatitis.  The limitation of incomplete mucosal visualization was also discussed.  No guarantees or  warranties were given.  She needs a surgical consultation for cholecystectomy to follow clearance of the bile duct.   Thank you for the courtesy of this consult.  Please contact me with any questions or concerns.  Charlie Pitter III Pager: [phone] Mon-Fri 8a-5p [phone] after 5p, weekends, holidays

## 2016-08-23 NOTE — Anesthesia Preprocedure Evaluation (Signed)
Anesthesia Evaluation  Patient identified by MRN, date of birth, ID band Patient awake    Reviewed: Allergy & Precautions, NPO status , Patient's Chart, lab work & pertinent test results  Airway Mallampati: I  TM Distance: >3 FB Neck ROM: Full    Dental no notable dental hx.    Pulmonary neg pulmonary ROS, former smoker,    breath sounds clear to auscultation       Cardiovascular negative cardio ROS   Rhythm:Regular Rate:Normal     Neuro/Psych negative neurological ROS  negative psych ROS   GI/Hepatic negative GI ROS, Neg liver ROS,   Endo/Other  negative endocrine ROS  Renal/GU negative Renal ROS  negative genitourinary   Musculoskeletal negative musculoskeletal ROS (+)   Abdominal   Peds negative pediatric ROS (+)  Hematology negative hematology ROS (+)   Anesthesia Other Findings   Reproductive/Obstetrics negative OB ROS                             Anesthesia Physical Anesthesia Plan  ASA: I  Anesthesia Plan: General   Post-op Pain Management:    Induction: Intravenous  Airway Management Planned: Oral ETT  Additional Equipment:   Intra-op Plan:   Post-operative Plan: Extubation in OR  Informed Consent: I have reviewed the patients History and Physical, chart, labs and discussed the procedure including the risks, benefits and alternatives for the proposed anesthesia with the patient or authorized representative who has indicated his/her understanding and acceptance.   Dental advisory given  Plan Discussed with:   Anesthesia Plan Comments:         Anesthesia Quick Evaluation

## 2016-08-23 NOTE — H&P (View-Only) (Signed)
[facility] Consult Note   History Autumn Dixon MRN # [id number]  Date of Admission: 08/22/2016 Date of Consultation: 08/23/2016 Referring physician: Dr. Kshitiz Alekh, MD  Reason for Consultation/Chief Complaint: Epigastric pain  Subjective  HPI:  This is a 41-year-old woman with no chronic digestive symptoms who began having severe episodic epigastric pain within the last several weeks. He will occur soon after eating, it is very intense in the epigastrium, nonradiating with some associated nausea. During an episode of this 2 days ago, she came to me with [facility] near [place]. She brings a report of her ultrasound and lab work as well as the ultrasound on a CD. She was found to have many gallbladder stones and reportedly choledocholithiasis. That time, her CBC was normal, AST 147, ALT 181, alkaline phosphatase 82 and total bilirubin 0.7.  Advised her to be admitted for further workup. She had to get home with her daughter, so she came back to [place] yesterday and then immediately to the ED. She was admitted for further workup. Her last episode was yesterday while driving home from the [place], she feels well today. During the episode she has no pain.  There is been no fever, night sweats, weight loss, dysphagia, altered bowel habits or rectal bleeding.  ROS: Musculoskeletal: Arthralgias and myalgias Psychiatric:   Chronic anxiety under control with medicines  All other systems are negative except as noted above in the HPI  Past Medical History Past Medical History:  Diagnosis Date  . Anxiety   . Cholecystitis   . Depression   . Family history of premature CAD 07/04/2016  . Fibromyalgia     Past Surgical History Past Surgical History:  Procedure Laterality Date  . GANGLION CYST EXCISION Right    wrist  . KNEE ARTHROSCOPY Right   . SHOULDER ARTHROSCOPY Right     Family History Family History  Problem Relation Age of Onset  . Colon polyps  Mother   . Ovarian cysts Mother   . Heart disease Father 27  . Diabetes Maternal Grandmother   . Colon polyps Maternal Grandmother   . Colitis Maternal Grandmother     ?  . Irritable bowel syndrome Maternal Grandmother   . Pancreatic cancer Maternal Grandfather   . Diabetes Maternal Grandfather   . Colon cancer Paternal Grandfather   . Heart disease Paternal Grandfather   . Diabetes Brother   . Ovarian cysts Sister   . Heart attack Paternal Aunt     died 60s with mi  . Heart disease Paternal Aunt   . Heart attack Paternal Uncle     died in early 60 s MI     Social History Social History   Social History  . Marital status: Married    Spouse name: N/A  . Number of children: 4  . Years of education: N/A   Occupational History  . secretary/school treasurer    Social History Main Topics  . Smoking status: Former Smoker    Years: 1.00    Types: Cigarettes    Quit date: 05/10/1997  . Smokeless tobacco: Never Used  . Alcohol use 0.6 oz/week    1 Glasses of wine per week     Comment: occasional  . Drug use: No  . Sexual activity: Not Asked   Other Topics Concern  . None   Social History Narrative  . None  She uncommonly drinks alcohol, is no longer a smoker and does not use any illicit drugs.  Allergies No Known   Allergies  Outpatient Meds Home medications from the H+P and/or nursing med reconciliation reviewed.  Inpatient med list reviewed  _____________________________________________________________________ Objective   Exam:  Current vital signs  Patient Vitals for the past 8 hrs:  BP Temp Temp src Pulse Resp SpO2  08/23/16 0640 100/61 98.3 F (36.8 C) Oral 97 18 100 %   No intake or output data in the 24 hours ending 08/23/16 0953  Physical Exam:    General: this is a Well-appearing female patient in no acute distress  Eyes: sclera anicteric, no redness  ENT: oral mucosa moist without lesions, no cervical or supraclavicular lymphadenopathy,  good dentition  CV: RRR without murmur, S1/S2, no JVD,, no peripheral edema  Resp: clear to auscultation bilaterally, normal RR and effort noted  GI: soft, no tenderness, with active bowel sounds. No guarding or palpable organomegaly noted  Skin; warm and dry, no rash or jaundice noted  Neuro: awake, alert and oriented x 3. Normal gross motor function and fluent speech.  Labs:   Recent Labs Lab 08/22/16 1800 08/23/16 0357  WBC 5.0 4.8  HGB 11.7* 11.0*  HCT 35.5* 32.6*  PLT 281 234    Recent Labs Lab 08/23/16 0357  NA 140  K 3.5  CL 107  CO2 25  BUN <5*  ALBUMIN 3.1*  ALKPHOS 87  ALT 274*  AST 189*  GLUCOSE 82    Recent Labs Lab 08/23/16 0357  INR 1.17    Radiologic studies: Ultrasound shows numerous gallstones, which also limits the evaluation of wall thickening. Choledocholithiasis is reportedly found , with a CBD of 6.3 mm No intrahepatic biliary ductal dilatation was reported. I reviewed the procedure report, but was unable to review images on the disc.  @ Impression:  Epigastric pain Cholelithiasis Choledocholithiasis Elevated LFTs  Although ultrasound lacks sensitivity and specificity for finding stones in the common bile duct, the elevated LFTs in this clinical setting lead to a high suspicion that the imaging findings are accurate.   Plan:  ERCP today. The procedure was discussed in great detail and a diagram of the anatomy was given to the patient during detailed description of the procedure, its benefits and risks.   The benefits and risks of the planned procedure were described in detail with the patient or (when appropriate) their health care proxy.  Risks were outlined as including, but not limited to, bleeding, infection, perforation, adverse medication reaction leading to cardiac or pulmonary decompensation, or pancreatitis.  The limitation of incomplete mucosal visualization was also discussed.  No guarantees or  warranties were given.  She needs a surgical consultation for cholecystectomy to follow clearance of the bile duct.   Thank you for the courtesy of this consult.  Please contact me with any questions or concerns.  Charlie Pitter III Pager: [phone] Mon-Fri 8a-5p [phone] after 5p, weekends, holidays

## 2016-08-23 NOTE — Consult Note (Signed)
[facility] Surgery Consult Note  Autumn STRENG Jul 26, 1975  [id number].    Requesting MD: Starla Link, MD Chief Complaint/Reason for Consult:symptomatic cholelithiasis  HPI:  Autumn Dixon is a 41 year-old female with a medical history of HTN, anxiety, and fibromyalgia who presented to [facility] 08/22/16 with a cc of upper abdominal pain. Patient describes intermittent, sharp, cramping epigastric pain that started on 08/11/16. The pain is epigastric and associated with meals. The pain is non-radiating and typically lasts 30 minutes. She denies associated fever, chills, nausea, vomiting, diarrhea, or urinary symptoms. RUQ U/S performed prior to the day of hospital admission was significant for cholelithiasis and choledocholithiasis. ED workup 4/15 further revealed AST 112, ALT 200, normal total bilirubin and lipase. The patient was admitted and underwent ERCP today by GI. General surgery has been asked to evaluate patient for possible cholecystectomy.   ROS: Review of Systems  Constitutional: Negative for chills and fever.  HENT: Negative for hearing loss.   Eyes: Negative for blurred vision.  Respiratory: Negative for cough and shortness of breath.   Cardiovascular: Negative for chest pain.  Gastrointestinal: Positive for abdominal pain and nausea. Negative for diarrhea and vomiting.  Genitourinary: Negative.   Musculoskeletal: Negative.   Skin: Negative for rash.  Neurological: Negative.   Endo/Heme/Allergies: Negative.   Psychiatric/Behavioral: Negative.   All other systems reviewed and are negative.   Family History  Problem Relation Age of Onset  . Colon polyps Mother   . Ovarian cysts Mother   . Heart disease Father 88  . Diabetes Maternal Grandmother   . Colon polyps Maternal Grandmother   . Colitis Maternal Grandmother     ?  Marland Kitchen Irritable bowel syndrome Maternal Grandmother   . Pancreatic cancer Maternal Grandfather   . Diabetes Maternal Grandfather   . Colon cancer Paternal  Grandfather   . Heart disease Paternal Grandfather   . Diabetes Brother   . Ovarian cysts Sister   . Heart attack Paternal Aunt     died 8s with mi  . Heart disease Paternal Aunt   . Heart attack Paternal Uncle     died in early 71 s MI     Past Medical History:  Diagnosis Date  . Anxiety   . Cholecystitis   . Depression   . Family history of premature CAD 07/04/2016  . Fibromyalgia     Past Surgical History:  Procedure Laterality Date  . GANGLION CYST EXCISION Right    wrist  . KNEE ARTHROSCOPY Right   . SHOULDER ARTHROSCOPY Right     Social History:  reports that she quit smoking about 19 years ago. Her smoking use included Cigarettes. She quit after 1.00 year of use. She has never used smokeless tobacco. She reports that she drinks about 0.6 oz of alcohol per week . She reports that she does not use drugs.  Allergies: No Known Allergies  Medications Prior to Admission  Medication Sig Dispense Refill  . alprazolam (XANAX) 2 MG tablet Take 2 mg by mouth at bedtime as needed for sleep or anxiety.    . colchicine 0.6 MG tablet Take 1 tablet (0.6 mg total) by mouth 2 (two) times daily. 180 tablet 0  . ibuprofen (ADVIL,MOTRIN) 200 MG tablet Take 800 mg by mouth every 6 (six) hours as needed for headache or moderate pain.    . metoprolol tartrate (LOPRESSOR) 25 MG tablet Take 0.5 tablets (12.5 mg total) by mouth 2 (two) times daily. 60 tablet 3  . sertraline (ZOLOFT) 100 MG  tablet Take 1 tablet (100 mg total) by mouth daily. (Patient not taking: Reported on 08/22/2016) 90 tablet 3    Blood pressure 109/67, pulse 70, temperature 98.7 F (37.1 C), temperature source Oral, resp. rate 18, height '5\' 10"'$  (1.778 m), weight 81.6 kg (180 lb), SpO2 100 %. Physical Exam: Physical Exam  Constitutional: She is oriented to person, place, and time. She appears well-developed and well-nourished.  HENT:  Head: Normocephalic.  Eyes: EOM are normal. Pupils are equal, round, and reactive to  light.  Cardiovascular: Normal rate and regular rhythm.   Pulmonary/Chest: Effort normal and breath sounds normal. She has no wheezes.  Abdominal: Soft. She exhibits no distension and no mass. There is no tenderness. There is no guarding.  Musculoskeletal: Normal range of motion.  Neurological: She is alert and oriented to person, place, and time.  Skin: Skin is warm and dry.  Psychiatric: She has a normal mood and affect.     Results for orders placed or performed during the hospital encounter of 08/22/16 (from the past 48 hour(s))  Lipase, blood     Status: None   Collection Time: 08/22/16  6:00 PM  Result Value Ref Range   Lipase 27 11 - 51 U/L  Comprehensive metabolic panel     Status: Abnormal   Collection Time: 08/22/16  6:00 PM  Result Value Ref Range   Sodium 136 135 - 145 mmol/L   Potassium 3.4 (L) 3.5 - 5.1 mmol/L   Chloride 100 (L) 101 - 111 mmol/L   CO2 26 22 - 32 mmol/L   Glucose, Bld 120 (H) 65 - 99 mg/dL   BUN <5 (L) 6 - 20 mg/dL   Creatinine, Ser 0.61 0.44 - 1.00 mg/dL   Calcium 9.0 8.9 - 10.3 mg/dL   Total Protein 6.0 (L) 6.5 - 8.1 g/dL   Albumin 3.8 3.5 - 5.0 g/dL   AST 112 (H) 15 - 41 U/L   ALT 200 (H) 14 - 54 U/L   Alkaline Phosphatase 75 38 - 126 U/L   Total Bilirubin 1.2 0.3 - 1.2 mg/dL   GFR calc non Af Amer >60 >60 mL/min   GFR calc Af Amer >60 >60 mL/min    Comment: (NOTE) The eGFR has been calculated using the CKD EPI equation. This calculation has not been validated in all clinical situations. eGFR's persistently <60 mL/min signify possible Chronic Kidney Disease.    Anion gap 10 5 - 15  CBC     Status: Abnormal   Collection Time: 08/22/16  6:00 PM  Result Value Ref Range   WBC 5.0 4.0 - 10.5 K/uL   RBC 3.85 (L) 3.87 - 5.11 MIL/uL   Hemoglobin 11.7 (L) 12.0 - 15.0 g/dL   HCT 35.5 (L) 36.0 - 46.0 %   MCV 92.2 78.0 - 100.0 fL   MCH 30.4 26.0 - 34.0 pg   MCHC 33.0 30.0 - 36.0 g/dL   RDW 12.6 11.5 - 15.5 %   Platelets 281 150 - 400 K/uL   I-Stat beta hCG blood, ED     Status: None   Collection Time: 08/22/16  8:38 PM  Result Value Ref Range   I-stat hCG, quantitative <5.0 <5 mIU/mL   Comment 3            Comment:   GEST. AGE      CONC.  (mIU/mL)   <=1 WEEK        5 - 50     2 WEEKS  50 - 500     3 WEEKS       100 - 10,000     4 WEEKS     1,000 - 30,000        FEMALE AND NON-PREGNANT FEMALE:     LESS THAN 5 mIU/mL   Urinalysis, Routine w reflex microscopic     Status: Abnormal   Collection Time: 08/22/16  9:39 PM  Result Value Ref Range   Color, Urine YELLOW YELLOW   APPearance CLEAR CLEAR   Specific Gravity, Urine 1.002 (L) 1.005 - 1.030   pH 6.0 5.0 - 8.0   Glucose, UA NEGATIVE NEGATIVE mg/dL   Hgb urine dipstick SMALL (A) NEGATIVE   Bilirubin Urine NEGATIVE NEGATIVE   Ketones, ur NEGATIVE NEGATIVE mg/dL   Protein, ur NEGATIVE NEGATIVE mg/dL   Nitrite NEGATIVE NEGATIVE   Leukocytes, UA NEGATIVE NEGATIVE   RBC / HPF 0-5 0 - 5 RBC/hpf   WBC, UA 0-5 0 - 5 WBC/hpf   Bacteria, UA NONE SEEN NONE SEEN   Squamous Epithelial / LPF 0-5 (A) NONE SEEN  Surgical pcr screen     Status: None   Collection Time: 08/23/16 12:16 AM  Result Value Ref Range   MRSA, PCR NEGATIVE NEGATIVE   Staphylococcus aureus NEGATIVE NEGATIVE    Comment:        The Xpert SA Assay (FDA approved for NASAL specimens in patients over 43 years of age), is one component of a comprehensive surveillance program.  Test performance has been validated by [facility] for patients greater than or equal to 71 year old. It is not intended to diagnose infection nor to guide or monitor treatment.   Comprehensive metabolic panel     Status: Abnormal   Collection Time: 08/23/16  3:57 AM  Result Value Ref Range   Sodium 140 135 - 145 mmol/L   Potassium 3.5 3.5 - 5.1 mmol/L   Chloride 107 101 - 111 mmol/L   CO2 25 22 - 32 mmol/L   Glucose, Bld 82 65 - 99 mg/dL   BUN <5 (L) 6 - 20 mg/dL   Creatinine, Ser 0.56 0.44 - 1.00 mg/dL   Calcium 8.2  (L) 8.9 - 10.3 mg/dL   Total Protein 5.0 (L) 6.5 - 8.1 g/dL   Albumin 3.1 (L) 3.5 - 5.0 g/dL   AST 189 (H) 15 - 41 U/L   ALT 274 (H) 14 - 54 U/L   Alkaline Phosphatase 87 38 - 126 U/L   Total Bilirubin 1.1 0.3 - 1.2 mg/dL   GFR calc non Af Amer >60 >60 mL/min   GFR calc Af Amer >60 >60 mL/min    Comment: (NOTE) The eGFR has been calculated using the CKD EPI equation. This calculation has not been validated in all clinical situations. eGFR's persistently <60 mL/min signify possible Chronic Kidney Disease.    Anion gap 8 5 - 15  CBC WITH DIFFERENTIAL     Status: Abnormal   Collection Time: 08/23/16  3:57 AM  Result Value Ref Range   WBC 4.8 4.0 - 10.5 K/uL   RBC 3.51 (L) 3.87 - 5.11 MIL/uL   Hemoglobin 11.0 (L) 12.0 - 15.0 g/dL   HCT 32.6 (L) 36.0 - 46.0 %   MCV 92.9 78.0 - 100.0 fL   MCH 31.3 26.0 - 34.0 pg   MCHC 33.7 30.0 - 36.0 g/dL   RDW 12.8 11.5 - 15.5 %   Platelets 234 150 - 400 K/uL   Neutrophils Relative %  47 %   Neutro Abs 2.3 1.7 - 7.7 K/uL   Lymphocytes Relative 43 %   Lymphs Abs 2.0 0.7 - 4.0 K/uL   Monocytes Relative 7 %   Monocytes Absolute 0.3 0.1 - 1.0 K/uL   Eosinophils Relative 3 %   Eosinophils Absolute 0.2 0.0 - 0.7 K/uL   Basophils Relative 0 %   Basophils Absolute 0.0 0.0 - 0.1 K/uL  Protime-INR     Status: None   Collection Time: 08/23/16  3:57 AM  Result Value Ref Range   Prothrombin Time 14.9 11.4 - 15.2 seconds   INR 1.17   Lipase, blood     Status: None   Collection Time: 08/23/16  3:57 AM  Result Value Ref Range   Lipase 23 11 - 51 U/L  Magnesium     Status: None   Collection Time: 08/23/16  3:57 AM  Result Value Ref Range   Magnesium 1.8 1.7 - 2.4 mg/dL   No results found.  Assessment/Plan Choledocholithiasis - s/p ERCP 08/23/16   Symptomatic cholelithiasis  - agree that patient is appropriate for laparoscopic cholecystectomy this admission -  NPO after midnight - LFT's in AM - if stable we will proceed with laparoscopic  cholecystectomy tomorrow afternoon by Dr. Georganna Skeans   Anxiety Palpitations - lopressor  Hypokalemia  VTE- SCD's ID - ciprofloxacin 4/16  Jill Alexanders, [facility] Surgery 08/23/2016, 2:36 PM Pager: [phone] Consults: [phone] Mon-Fri 7:00 am-4:30 pm Sat-Sun 7:00 am-11:30 am  Patient examined and I agree with the assessment and plan Seen together and exam as documented. Cholelithiasis and choledocholithiasis, S/P ERCP. FOr laparoscopic cholecystectomy tomorrow afternoon. Procedure, risks, and benefits discussed. She agrees. Georganna Skeans, MD, MPH, FACS Trauma: [phone] General Surgery: [phone]  08/23/2016 4:40 PM

## 2016-08-23 NOTE — Op Note (Signed)
[facility] Patient Name: Autumn Dixon Procedure Date : 08/23/2016 MRN: [id number] Attending MD: Starr Lake. Myrtie Neither , MD Date of Birth: [birth year] CSN: [id number] Age: 41 Admit Type: Inpatient Procedure:                ERCP Indications:              Bile duct stone(s), Elevated liver enzymes Providers:                Sherilyn Cooter L. Myrtie Neither, MD, Priscella Mann, RN, Oletha Blend, Technician Referring MD:              Medicines:                General Anesthesia, Cipro 400 mg IV, Indomethacin                            100 mg PR Complications:            No immediate complications. Estimated Blood Loss:     Estimated blood loss: none. Procedure:                Pre-Anesthesia Assessment:                           - Prior to the procedure, a History and Physical                            was performed, and patient medications and                            allergies were reviewed. The patient's tolerance of                            previous anesthesia was also reviewed. The risks                            and benefits of the procedure and the sedation                            options and risks were discussed with the patient.                            All questions were answered, and informed consent                            was obtained. Prior Anticoagulants: The patient has                            taken no previous anticoagulant or antiplatelet                            agents. ASA Grade Assessment: II - A patient with  mild systemic disease. After reviewing the risks                            and benefits, the patient was deemed in                            satisfactory condition to undergo the procedure.                           After obtaining informed consent, the scope was                            passed under direct vision. Throughout the                            procedure, the patient's blood pressure,  pulse, and                            oxygen saturations were monitored continuously. The                            [id number] ([id number]) scope was introduced through                            the mouth, and used to inject contrast into and                            used to inject contrast into the bile duct. The                            ERCP was accomplished without difficulty. The                            patient tolerated the procedure well. Scope In: Scope Out: Findings:      The scout film was normal. The esophagus was successfully intubated       under direct vision. The scope was advanced to a normal major papilla in       the descending duodenum without detailed examination of the pharynx,       larynx and associated structures, and upper GI tract. The upper GI tract       was grossly normal. 0.035 inch x 260 cm straight Hydra Jagwire was       passed into the biliary tree. The traction (standard) sphincterotome was       passed over the guidewire and the bile duct was then deeply cannulated.       Contrast was injected. I personally interpreted the bile duct images.       There was brisk flow of contrast through the ducts. Image quality was       excellent. Contrast extended to the hepatic ducts. The main bile duct       contained multiple stones, the largest of which was 6 mm in diameter.       The lower third of the main bile duct was diffusely dilated. The largest       diameter was 8 mm.  The gallbladder contained dozens of stones. A 10 mm       biliary sphincterotomy was made with a traction (standard)       sphincterotome using ERBE electrocautery. There was no       post-sphincterotomy bleeding. The biliary tree was swept with a 12 mm       balloon starting at the bifurcation. Six stones were removed. No stones       remained, confirmed on balloon occlusion cholangiogram. The total       fluoroscopy exposure time was 4 minutes and 25 seconds. Impression:                - The lower third of the main bile duct was dilated.                           - Choledocholithiasis was found. Complete removal                            was accomplished by biliary sphincterotomy and                            balloon extraction.                           - A biliary sphincterotomy was performed.                           - The biliary tree was swept. Recommendation:           - Surgical consult today for cholecystectomy                            tomorrow. Procedure Code(s):        --- Professional ---                           [phone], Endoscopic retrograde                            cholangiopancreatography (ERCP); with removal of                            calculi/debris from biliary/pancreatic duct(s)                           43262, Endoscopic retrograde                            cholangiopancreatography (ERCP); with                            sphincterotomy/papillotomy Diagnosis Code(s):        --- Professional ---                           K80.50, Calculus of bile duct without cholangitis                            or cholecystitis without obstruction  R74.8, Abnormal levels of other serum enzymes                           K83.8, Other specified diseases of biliary tract CPT copyright 2016 American Medical Association. All rights reserved. The codes documented in this report are preliminary and upon coder review may  be revised to meet current compliance requirements. Leocadia Idleman L. Myrtie Neither, MD 08/23/2016 3:06:43 PM This report has been signed electronically. Number of Addenda: 0

## 2016-08-23 NOTE — Progress Notes (Signed)
Patient ID: [name], female   DOB: Mar 24, 1976, 40 y.o.   MRN: [id number]  PROGRESS NOTE    SHAKEA ISIP  WUJ:[id number] DOB: 01/22/1976 DOA: 08/22/2016 PCP: Elvina Sidle, MD   Brief Narrative:  41 y.o. female with medical history significant for anxiety, hypertension, and fibromyalgia who presented to the emergency department for evaluation of episodic epigastric pain. She was diagnosed with cholelithiasis and choledocholithiasis in outside hospital. She was admitted here with biliary colic for possible ERCP today by GI.  Assessment & Plan:   Principal Problem:   Cholelithiasis with choledocholithiasis Active Problems:   Anxiety state   Biliary colic   Palpitations  1. Choledocholithiasis and cholelithiasis with biliary colic and acute transaminitis:   - continue NPO; await GI eval for probable ERCP today. - called general surgery consult for probable cholecystectomy. Patient wants to have cholecystectomy during this admission. - no signs of biliary infection currently. Monitor off antibiotics - iv fluids and iv analgesics - repeat labs in AM   2. Anxiety disorder  - Appears stable  - Continue prn Xanax   3. Palpitations - Evaluated by cardiology in Feb '18, attributed to PVC's   - Improved with Lopressor, will continue   4. Hypokalemia  - improved   DVT prophylaxis: SCDs, hold lovenox for probable surgery Code Status: Full  Family Communication: none present at bedside Disposition Plan: Home in 1-2 days probably after cholecystectomy Consults called: Gastroenterology; General surgery   Procedures: None so far   Antimicrobials :None  Subjective: Patient seen and examined at bedside. She currently is denying any abdominal pain, nausea, vomiting or fever.  Objective: Vitals:   08/22/16 2045 08/22/16 2145 08/22/16 2222 08/23/16 0640  BP: 102/76 104/68 102/60 100/61  Pulse: 71 68 76 97  Resp: Temp:   98.4 F (36.9 C) 98.3 F (36.8 C)    TempSrc:   Oral Oral  SpO2: 100% 100% 100% 100%  Weight:   81.6 kg (180 lb)   Height:    (1.778 m)    No intake or output data in the 24 hours ending 08/23/16 0813 Filed Weights   08/22/16 1744 08/22/16 2222  Weight: 81.6 kg (180 lb) 81.6 kg (180 lb)    Examination:  General exam: Appears calm and comfortable  Respiratory system: Clear to auscultation. Respiratory effort normal. Cardiovascular system: S1 & S2 heard, RRR. No JVD, murmurs, rubs, gallops or clicks. No pedal edema. Gastrointestinal system: Abdomen is nondistended, soft and nontender. No organomegaly or masses felt. Normal bowel sounds heard. Central nervous system: Alert and oriented. No focal neurological deficits. Extremities: no cyanosis, clubbing and edema   Data Reviewed: I have personally reviewed following labs and imaging studies  CBC:  Recent Labs Lab 08/22/16 1800 08/23/16 0357  WBC 5.0 4.8  NEUTROABS  --  2.3  HGB 11.7* 11.0*  HCT 35.5* 32.6*  MCV 92.2 92.9  PLT 281 234   Basic Metabolic Panel:  Recent Labs Lab 08/22/16 1800 08/23/16 0357  NA 136 140  K 3.4* 3.5  CL 100* 107  CO2 26 25  GLUCOSE 120* 82  BUN <5* <5*  CREATININE 0.61 0.56  CALCIUM 9.0 8.2*  MG  --  1.8   GFR: Estimated Creatinine Clearance: 103.1 mL/min (by C-G formula based on SCr of 0.56 mg/dL). Liver Function Tests:  Recent Labs Lab 08/22/16 1800 08/23/16 0357  AST 112* 189*  ALT 200* 274*  ALKPHOS 75 87  BILITOT 1.2 1.1  PROT 6.0* 5.0*  ALBUMIN 3.8 3.1*    Recent Labs Lab 08/22/16 1800 08/23/16 0357  LIPASE 27 23   No results for input(s): AMMONIA in the last 168 hours. Coagulation Profile:  Recent Labs Lab 08/23/16 0357  INR 1.17   Cardiac Enzymes: No results for input(s): CKTOTAL, CKMB, CKMBINDEX, TROPONINI in the last 168 hours. BNP (last 3 results) No results for input(s): PROBNP in the last 8760 hours. HbA1C: No results for input(s): HGBA1C in the last 72 hours. CBG: No  results for input(s): GLUCAP in the last 168 hours. Lipid Profile: No results for input(s): CHOL, HDL, LDLCALC, TRIG, CHOLHDL, LDLDIRECT in the last 72 hours. Thyroid Function Tests: No results for input(s): TSH, T4TOTAL, FREET4, T3FREE, THYROIDAB in the last 72 hours. Anemia Panel: No results for input(s): VITAMINB12, FOLATE, FERRITIN, TIBC, IRON, RETICCTPCT in the last 72 hours. Sepsis Labs: No results for input(s): PROCALCITON, LATICACIDVEN in the last 168 hours.  Recent Results (from the past 240 hour(s))  Surgical pcr screen     Status: None   Collection Time: 08/23/16 12:16 AM  Result Value Ref Range Status   MRSA, PCR NEGATIVE NEGATIVE Final   Staphylococcus aureus NEGATIVE NEGATIVE Final    Comment:        The Xpert SA Assay (FDA approved for NASAL specimens in patients over 38 years of age), is one component of a comprehensive surveillance program.  Test performance has been validated by [facility] for patients greater than or equal to 50 year old. It is not intended to diagnose infection nor to guide or monitor treatment.          Radiology Studies: No results found.      Scheduled Meds: . colchicine  0.6 mg Oral Once  . enoxaparin (LOVENOX) injection  40 mg Subcutaneous Q24H  . metoprolol tartrate  12.5 mg Oral BID   Continuous Infusions: . sodium chloride 110 mL/hr at 08/22/16 2357     LOS: 1 day        Glade Lloyd, MD [facility] Pager [phone]  If 7PM-7AM, please contact night-coverage [email or web address] Password [facility] 08/23/2016, 8:13 AM

## 2016-08-23 NOTE — Anesthesia Procedure Notes (Signed)
Procedure Name: Intubation Date/Time: 08/23/2016 2:07 PM Performed by: Tressia Miners LEFFEW Pre-anesthesia Checklist: Patient identified, Patient being monitored, Timeout performed, Emergency Drugs available and Suction available Patient Re-evaluated:Patient Re-evaluated prior to inductionOxygen Delivery Method: Circle System Utilized Preoxygenation: Pre-oxygenation with 100% oxygen Intubation Type: IV induction Ventilation: Mask ventilation without difficulty Laryngoscope Size: Mac and 4 Grade View: Grade I Tube type: Oral Tube size: 7.5 mm Number of attempts: 1 Airway Equipment and Method: Stylet Placement Confirmation: ETT inserted through vocal cords under direct vision,  positive ETCO2 and breath sounds checked- equal and bilateral Secured at: 24 cm Tube secured with: Tape Dental Injury: Teeth and Oropharynx as per pre-operative assessment

## 2016-08-23 NOTE — Transfer of Care (Signed)
Immediate Anesthesia Transfer of Care Note  Patient: Autumn Dixon  Procedure(s) Performed: Procedure(s): ENDOSCOPIC RETROGRADE CHOLANGIOPANCREATOGRAPHY (ERCP) (N/A)  Patient Location: PACU and Endoscopy Unit  Anesthesia Type:General  Level of Consciousness: awake, alert , oriented and patient cooperative  Airway & Oxygen Therapy: Patient Spontanous Breathing and Patient connected to nasal cannula oxygen  Post-op Assessment: Report given to RN and Post -op Vital signs reviewed and stable  Post vital signs: Reviewed and stable  Last Vitals:  Vitals:   08/23/16 1513 08/23/16 1528  BP:  106/76  Pulse: 63 60  Resp: 17 17  Temp:  36.5 C    Last Pain:  Vitals:   08/23/16 1528  TempSrc: Oral  PainSc:          Complications: No apparent anesthesia complications

## 2016-08-24 ENCOUNTER — Observation Stay (HOSPITAL_COMMUNITY): Payer: BC Managed Care – PPO | Admitting: Certified Registered Nurse Anesthetist

## 2016-08-24 ENCOUNTER — Encounter (HOSPITAL_COMMUNITY): Payer: Self-pay | Admitting: Certified Registered Nurse Anesthetist

## 2016-08-24 ENCOUNTER — Encounter (HOSPITAL_COMMUNITY)
Admission: EM | Disposition: A | Discharge status: Home / Self Care | Payer: Self-pay | Source: Home / Self Care | Attending: Emergency Medicine

## 2016-08-24 DIAGNOSIS — K8070 Calculus of gallbladder and bile duct without cholecystitis without obstruction: Secondary | ICD-10-CM | POA: Diagnosis not present

## 2016-08-24 HISTORY — PX: CHOLECYSTECTOMY: SHX55

## 2016-08-24 LAB — CBC WITH DIFFERENTIAL/PLATELET
Basophils Absolute: 0 10*3/uL (ref 0.0–0.1)
Basophils Relative: 0 %
Eosinophils Absolute: 0.1 10*3/uL (ref 0.0–0.7)
Eosinophils Relative: 2 %
HCT: 32.2 % — ABNORMAL LOW (ref 36.0–46.0)
Hemoglobin: 10.9 g/dL — ABNORMAL LOW (ref 12.0–15.0)
Lymphocytes Relative: 36 %
Lymphs Abs: 2 10*3/uL (ref 0.7–4.0)
MCH: 31.1 pg (ref 26.0–34.0)
MCHC: 33.9 g/dL (ref 30.0–36.0)
MCV: 92 fL (ref 78.0–100.0)
Monocytes Absolute: 0.4 10*3/uL (ref 0.1–1.0)
Monocytes Relative: 8 %
Neutro Abs: 3 10*3/uL (ref 1.7–7.7)
Neutrophils Relative %: 54 %
Platelets: 233 10*3/uL (ref 150–400)
RBC: 3.5 MIL/uL — ABNORMAL LOW (ref 3.87–5.11)
RDW: 12.4 % (ref 11.5–15.5)
WBC: 5.5 10*3/uL (ref 4.0–10.5)

## 2016-08-24 LAB — COMPREHENSIVE METABOLIC PANEL
ALT: 179 U/L — ABNORMAL HIGH (ref 14–54)
AST: 65 U/L — ABNORMAL HIGH (ref 15–41)
Albumin: 3.1 g/dL — ABNORMAL LOW (ref 3.5–5.0)
Alkaline Phosphatase: 74 U/L (ref 38–126)
Anion gap: 5 (ref 5–15)
BUN: 5 mg/dL — ABNORMAL LOW (ref 6–20)
CO2: 27 mmol/L (ref 22–32)
Calcium: 8.5 mg/dL — ABNORMAL LOW (ref 8.9–10.3)
Chloride: 107 mmol/L (ref 101–111)
Creatinine, Ser: 0.62 mg/dL (ref 0.44–1.00)
GFR calc Af Amer: >60 mL/min (ref >60)
GFR calc non Af Amer: >60 mL/min (ref >60)
Glucose, Bld: 96 mg/dL (ref 65–99)
Potassium: 3.9 mmol/L (ref 3.5–5.1)
Sodium: 139 mmol/L (ref 135–145)
Total Bilirubin: 0.6 mg/dL (ref 0.3–1.2)
Total Protein: 5.1 g/dL — ABNORMAL LOW (ref 6.5–8.1)

## 2016-08-24 SURGERY — LAPAROSCOPIC CHOLECYSTECTOMY WITH INTRAOPERATIVE CHOLANGIOGRAM
Anesthesia: General | Site: Abdomen

## 2016-08-24 MED ORDER — BUPIVACAINE-EPINEPHRINE 0.25% -1:200000 IJ SOLN: INTRAMUSCULAR | Status: DC | PRN

## 2016-08-24 MED ORDER — LIDOCAINE 2% (20 MG/ML) 5 ML SYRINGE
INTRAMUSCULAR | Status: AC
  Filled 2016-08-24: qty 5

## 2016-08-24 MED ORDER — IOPAMIDOL (ISOVUE-300) INJECTION 61%
INTRAVENOUS | Status: AC
  Filled 2016-08-24: qty 50

## 2016-08-24 MED ORDER — NEOSTIGMINE METHYLSULFATE 10 MG/10ML IV SOLN
INTRAVENOUS | Status: DC | PRN
  Administered 2016-08-24: 4 mg via INTRAVENOUS

## 2016-08-24 MED ORDER — ONDANSETRON HCL 4 MG/2ML IJ SOLN
INTRAMUSCULAR | Status: AC
  Filled 2016-08-24: qty 2

## 2016-08-24 MED ORDER — BUPIVACAINE HCL (PF) 0.25 % IJ SOLN
INTRAMUSCULAR | Status: DC | PRN
  Administered 2016-08-24: 15 mL

## 2016-08-24 MED ORDER — FENTANYL CITRATE (PF) 100 MCG/2ML IJ SOLN
INTRAMUSCULAR | Status: DC | PRN
  Administered 2016-08-24 (×5): 50 ug via INTRAVENOUS

## 2016-08-24 MED ORDER — LIDOCAINE 2% (20 MG/ML) 5 ML SYRINGE
INTRAMUSCULAR | Status: DC | PRN
  Administered 2016-08-24: 80 mg via INTRAVENOUS

## 2016-08-24 MED ORDER — PHENYLEPHRINE 40 MCG/ML (10ML) SYRINGE FOR IV PUSH (FOR BLOOD PRESSURE SUPPORT)
PREFILLED_SYRINGE | INTRAVENOUS | Status: AC
  Filled 2016-08-24: qty 10

## 2016-08-24 MED ORDER — DEXAMETHASONE SODIUM PHOSPHATE 10 MG/ML IJ SOLN
INTRAMUSCULAR | Status: DC | PRN
  Administered 2016-08-24: 10 mg via INTRAVENOUS

## 2016-08-24 MED ORDER — ONDANSETRON HCL 4 MG/2ML IJ SOLN
INTRAMUSCULAR | Status: DC | PRN
  Administered 2016-08-24: 4 mg via INTRAVENOUS

## 2016-08-24 MED ORDER — BUPIVACAINE HCL (PF) 0.25 % IJ SOLN
INTRAMUSCULAR | Status: AC
  Filled 2016-08-24: qty 30

## 2016-08-24 MED ORDER — PROPOFOL 10 MG/ML IV BOLUS
INTRAVENOUS | Status: DC | PRN
Start: 2016-08-24 — End: 2016-08-24
  Administered 2016-08-24: 150 mg via INTRAVENOUS

## 2016-08-24 MED ORDER — ROCURONIUM BROMIDE 10 MG/ML (PF) SYRINGE
PREFILLED_SYRINGE | INTRAVENOUS | Status: DC | PRN
  Administered 2016-08-24: 50 mg via INTRAVENOUS
  Administered 2016-08-24: 10 mg via INTRAVENOUS

## 2016-08-24 MED ORDER — OXYCODONE HCL 5 MG/5ML PO SOLN
5.0000 mg | Freq: Once | ORAL | Status: AC | PRN
Start: 2016-08-24 — End: 2016-08-24

## 2016-08-24 MED ORDER — KETOROLAC TROMETHAMINE 30 MG/ML IJ SOLN
INTRAMUSCULAR | Status: DC | PRN
  Administered 2016-08-24: 30 mg via INTRAVENOUS

## 2016-08-24 MED ORDER — DEXAMETHASONE SODIUM PHOSPHATE 10 MG/ML IJ SOLN
INTRAMUSCULAR | Status: AC
  Filled 2016-08-24: qty 1

## 2016-08-24 MED ORDER — ONDANSETRON HCL 4 MG/2ML IJ SOLN
4.0000 mg | Freq: Once | INTRAMUSCULAR | Status: AC | PRN
  Administered 2016-08-24: 4 mg via INTRAVENOUS

## 2016-08-24 MED ORDER — FENTANYL CITRATE (PF) 100 MCG/2ML IJ SOLN
INTRAMUSCULAR | Status: AC
  Filled 2016-08-24: qty 2

## 2016-08-24 MED ORDER — MIDAZOLAM HCL 2 MG/2ML IJ SOLN
INTRAMUSCULAR | Status: AC
  Filled 2016-08-24: qty 2

## 2016-08-24 MED ORDER — LACTATED RINGERS IV SOLN
INTRAVENOUS | Status: DC
  Administered 2016-08-24 (×3): via INTRAVENOUS

## 2016-08-24 MED ORDER — CIPROFLOXACIN IN D5W 400 MG/200ML IV SOLN
400.0000 mg | Freq: Once | INTRAVENOUS | Status: AC
  Administered 2016-08-24: 400 mg via INTRAVENOUS
  Filled 2016-08-24: qty 200

## 2016-08-24 MED ORDER — FENTANYL CITRATE (PF) 100 MCG/2ML IJ SOLN
25.0000 ug | INTRAMUSCULAR | Status: DC | PRN
  Administered 2016-08-24 (×3): 50 ug via INTRAVENOUS

## 2016-08-24 MED ORDER — OXYCODONE HCL 5 MG PO TABS
ORAL_TABLET | ORAL | Status: AC
  Filled 2016-08-24: qty 1

## 2016-08-24 MED ORDER — FENTANYL CITRATE (PF) 100 MCG/2ML IJ SOLN
INTRAMUSCULAR | Status: AC
Start: 2016-08-24 — End: 2016-08-25
  Filled 2016-08-24: qty 2

## 2016-08-24 MED ORDER — OXYCODONE HCL 5 MG PO TABS
5.0000 mg | ORAL_TABLET | Freq: Once | ORAL | Status: AC | PRN
Start: 2016-08-24 — End: 2016-08-24
  Administered 2016-08-24: 5 mg via ORAL

## 2016-08-24 MED ORDER — MIDAZOLAM HCL 5 MG/5ML IJ SOLN
INTRAMUSCULAR | Status: DC | PRN
  Administered 2016-08-24 (×2): 1 mg via INTRAVENOUS

## 2016-08-24 MED ORDER — FENTANYL CITRATE (PF) 250 MCG/5ML IJ SOLN
INTRAMUSCULAR | Status: AC
  Filled 2016-08-24: qty 5

## 2016-08-24 MED ORDER — MORPHINE SULFATE (PF) 2 MG/ML IV SOLN
2.0000 mg | INTRAVENOUS | Status: DC | PRN
  Administered 2016-08-24: 4 mg via INTRAVENOUS
  Filled 2016-08-24: qty 2

## 2016-08-24 MED ORDER — OXYCODONE HCL 5 MG PO TABS
5.0000 mg | ORAL_TABLET | ORAL | Status: DC | PRN
  Administered 2016-08-24 – 2016-08-25 (×4): 10 mg via ORAL
  Filled 2016-08-24 (×4): qty 2

## 2016-08-24 MED ORDER — 0.9 % SODIUM CHLORIDE (POUR BTL) OPTIME
TOPICAL | Status: DC | PRN
  Administered 2016-08-24: 1000 mL

## 2016-08-24 MED ORDER — ROCURONIUM BROMIDE 50 MG/5ML IV SOSY
PREFILLED_SYRINGE | INTRAVENOUS | Status: AC
  Filled 2016-08-24: qty 5

## 2016-08-24 MED ORDER — GLYCOPYRROLATE 0.2 MG/ML IJ SOLN
INTRAMUSCULAR | Status: DC | PRN
  Administered 2016-08-24: 0.6 mg via INTRAVENOUS

## 2016-08-24 MED ORDER — DEXTROSE-NACL 5-0.9 % IV SOLN
INTRAVENOUS | Status: AC
  Administered 2016-08-24: 03:00:00 via INTRAVENOUS

## 2016-08-24 MED ORDER — SODIUM CHLORIDE 0.9 % IR SOLN
Status: DC | PRN
  Administered 2016-08-24: 1000 mL

## 2016-08-24 MED ORDER — NEOSTIGMINE METHYLSULFATE 5 MG/5ML IV SOSY
PREFILLED_SYRINGE | INTRAVENOUS | Status: AC
  Filled 2016-08-24: qty 5

## 2016-08-24 SURGICAL SUPPLY — 50 items
ADH SKN CLS APL DERMABOND .7 (GAUZE/BANDAGES/DRESSINGS) ×1
APPLIER CLIP 5 13 M/L LIGAMAX5 (MISCELLANEOUS) ×3
APR CLP MED LRG 5 ANG JAW (MISCELLANEOUS) ×1
BAG SPEC RTRVL 10 TROC 200 (ENDOMECHANICALS) ×2
BLADE CLIPPER SURG (BLADE) IMPLANT
CANISTER SUCT 3000ML PPV (MISCELLANEOUS) ×3 IMPLANT
CHLORAPREP W/TINT 26ML (MISCELLANEOUS) ×3 IMPLANT
CLIP APPLIE 5 13 M/L LIGAMAX5 (MISCELLANEOUS) ×1 IMPLANT
COVER MAYO STAND STRL (DRAPES) ×3 IMPLANT
COVER SURGICAL LIGHT HANDLE (MISCELLANEOUS) ×3 IMPLANT
DERMABOND ADVANCED (GAUZE/BANDAGES/DRESSINGS) ×2
DERMABOND ADVANCED .7 DNX12 (GAUZE/BANDAGES/DRESSINGS) ×1 IMPLANT
DRAPE C-ARM 42X72 X-RAY (DRAPES) ×3 IMPLANT
ELECT REM PT RETURN 9FT ADLT (ELECTROSURGICAL) ×3
ELECTRODE REM PT RTRN 9FT ADLT (ELECTROSURGICAL) ×1 IMPLANT
ENDOLOOP SUT PDS II  0 18 (SUTURE) ×2
ENDOLOOP SUT PDS II 0 18 (SUTURE) IMPLANT
FILTER SMOKE EVAC LAPAROSHD (FILTER) IMPLANT
GLOVE BIO SURGEON STRL SZ 6 (GLOVE) ×2 IMPLANT
GLOVE BIO SURGEON STRL SZ8 (GLOVE) ×5 IMPLANT
GLOVE BIOGEL PI IND STRL 6.5 (GLOVE) IMPLANT
GLOVE BIOGEL PI IND STRL 8 (GLOVE) ×1 IMPLANT
GLOVE BIOGEL PI INDICATOR 6.5 (GLOVE) ×4
GLOVE BIOGEL PI INDICATOR 8 (GLOVE) ×2
GOWN STRL REUS W/ TWL LRG LVL3 (GOWN DISPOSABLE) ×2 IMPLANT
GOWN STRL REUS W/ TWL XL LVL3 (GOWN DISPOSABLE) ×1 IMPLANT
GOWN STRL REUS W/TWL LRG LVL3 (GOWN DISPOSABLE) ×6
GOWN STRL REUS W/TWL XL LVL3 (GOWN DISPOSABLE) ×3
KIT BASIN OR (CUSTOM PROCEDURE TRAY) ×3 IMPLANT
KIT ROOM TURNOVER OR (KITS) ×3 IMPLANT
L-HOOK LAP DISP 36CM (ELECTROSURGICAL) ×3
LHOOK LAP DISP 36CM (ELECTROSURGICAL) ×1 IMPLANT
NEEDLE 22X1 1/2 (OR ONLY) (NEEDLE) ×3 IMPLANT
NS IRRIG 1000ML POUR BTL (IV SOLUTION) ×3 IMPLANT
PAD ARMBOARD 7.5X6 YLW CONV (MISCELLANEOUS) ×3 IMPLANT
PENCIL BUTTON HOLSTER BLD 10FT (ELECTRODE) ×3 IMPLANT
POUCH RETRIEVAL ECOSAC 10 (ENDOMECHANICALS) ×1 IMPLANT
POUCH RETRIEVAL ECOSAC 10MM (ENDOMECHANICALS) ×4
SCISSORS LAP 5X35 DISP (ENDOMECHANICALS) ×3 IMPLANT
SET CHOLANGIOGRAPH 5 50 .035 (SET/KITS/TRAYS/PACK) ×3 IMPLANT
SET IRRIG TUBING LAPAROSCOPIC (IRRIGATION / IRRIGATOR) ×3 IMPLANT
SLEEVE ENDOPATH XCEL 5M (ENDOMECHANICALS) ×6 IMPLANT
SPECIMEN JAR SMALL (MISCELLANEOUS) ×3 IMPLANT
SUT VIC AB 4-0 PS2 27 (SUTURE) ×3 IMPLANT
TOWEL OR 17X24 6PK STRL BLUE (TOWEL DISPOSABLE) ×3 IMPLANT
TOWEL OR 17X26 10 PK STRL BLUE (TOWEL DISPOSABLE) ×3 IMPLANT
TRAY LAPAROSCOPIC MC (CUSTOM PROCEDURE TRAY) ×3 IMPLANT
TROCAR XCEL BLUNT TIP 100MML (ENDOMECHANICALS) ×3 IMPLANT
TROCAR XCEL NON-BLD 5MMX100MML (ENDOMECHANICALS) ×3 IMPLANT
TUBING INSUFFLATION (TUBING) ×5 IMPLANT

## 2016-08-24 NOTE — Anesthesia Preprocedure Evaluation (Signed)
Anesthesia Evaluation  Patient identified by MRN, date of birth, ID band Patient awake    Reviewed: Allergy & Precautions, NPO status , Patient's Chart, lab work & pertinent test results  Airway Mallampati: II  TM Distance: >3 FB Neck ROM: Full    Dental  (+) Teeth Intact, Dental Advisory Given   Pulmonary former smoker,    breath sounds clear to auscultation       Cardiovascular  Rhythm:Regular Rate:Normal     Neuro/Psych    GI/Hepatic   Endo/Other    Renal/GU      Musculoskeletal   Abdominal   Peds  Hematology   Anesthesia Other Findings   Reproductive/Obstetrics                             Anesthesia Physical Anesthesia Plan  ASA: II  Anesthesia Plan: General   Post-op Pain Management:    Induction: Intravenous  Airway Management Planned: Oral ETT  Additional Equipment:   Intra-op Plan:   Post-operative Plan: Extubation in OR  Informed Consent: I have reviewed the patients History and Physical, chart, labs and discussed the procedure including the risks, benefits and alternatives for the proposed anesthesia with the patient or authorized representative who has indicated his/her understanding and acceptance.   Dental advisory given  Plan Discussed with: CRNA and Anesthesiologist  Anesthesia Plan Comments:         Anesthesia Quick Evaluation  

## 2016-08-24 NOTE — Op Note (Signed)
08/22/2016 - 08/24/2016  3:12 PM  PATIENT:  Autumn Dixon  41 y.o. female  PRE-OPERATIVE DIAGNOSIS:  Cholelithiasis, recent choledocholithiasis  POST-OPERATIVE DIAGNOSIS:  Chronic cholecystitis, recent choledocholithiasis  PROCEDURE:  Procedure(s): LAPAROSCOPIC CHOLECYSTECTOMY  SURGEON:  Surgeon(s): Violeta Gelinas, MD  ASSISTANTS: Bailey Mech, PAC   ANESTHESIA:   local and general  EBL:  No intake/output data recorded.  BLOOD ADMINISTERED:none  DRAINS: none   SPECIMEN:  Excision  DISPOSITION OF SPECIMEN:  PATHOLOGY  COUNTS:  YES  DICTATION: .Dragon Dictation FIndings: Chronic cholecystitis  Procedure in detail: [name] presents for cholecystectomy. She was identified in the preop holding area. Informed consent was obtained. She received intravenous antiemetics. She was brought to the operating room and general endotracheal anesthesia was administered by the anesthesia staff.  her abdomen was prepped and draped in a sterile fashion. Time out procedure was performed. The infraumbilical region was infiltrated with local. Infraumbilical incision was made. Subcutaneous tissues were dissected down revealing the anterior fascia. This was divided sharply along the midline. Peritoneal cavity was entered under direct vision without complication. A 0 Vicryl pursestring was placed around the fascial opening. Hassan trocar was inserted into the abdomen. The abdomen was insufflated with carbon dioxide in standard fashion. Under direct vision a 5mm epigastric and 5 mm mid abdomen port 2 were placed. Local was used at each port site. Laparoscopic exploration revealed multiple gallstones visible in the gallbladder and evidence of chronic cholecystitis with filmy omental adhesions attached to the gallbladder. The dome the gallbladder was retracted superior medially. The omental adhesions were carefully swept away. There were some adhesions to the duodenum which were soft Denyse Dago also swept away revealing  the infundibulum. The infundibulum was retracted inferolaterally. Dissection began medially. Initially, identified the cystic artery over the top of the cystic duct. This was clipped twice proximally once distally and divided. Further dissection led to the critical view of safety between the infundibulum of the gallbladder, the liver, and the gallbladder itself. Once we had excellent visualization. A clip was placed distally on the cystic duct and 3 clips were attempted to be placed proximally. The cystic duct, however, was too large for the clips to completely seal so I placed an Endoloop of PDS. Excellent closure. The gallbladder was taken off the liver bed with cautery. Excellent hemostasis was obtained. Gallbladder was placed in a bag and removed from the abdomen. It was sent to pathology. Liver bed was irrigated and was completely dry. Clips remain in good position. Ports were removed under direct vision. Pneumoperitoneum was released. Infraumbilical fascia was closed by tying the pursestring. All 4 wounds were irrigated and closed with running 4 Vicryl subcuticular followed by Dermabond. All counts were correct. She tolerated procedure well without apparent complication and was taken recovery in stable condition.  PATIENT DISPOSITION:  PACU - hemodynamically stable.   Delay start of Pharmacological VTE agent (>24hrs) due to surgical blood loss or risk of bleeding:  no  Violeta Gelinas, MD, MPH, FACS Pager: [phone]  4/17/20183:12 PM

## 2016-08-24 NOTE — Anesthesia Postprocedure Evaluation (Addendum)
Anesthesia Post Note  Patient: Autumn Dixon  Procedure(s) Performed: Procedure(s) (LRB): LAPAROSCOPIC CHOLECYSTECTOMY (N/A)  Patient location during evaluation: PACU Anesthesia Type: General Level of consciousness: awake, awake and alert and oriented       Last Vitals:  Vitals:   08/24/16 1628 08/24/16 1649  BP: (!) 98/57 106/66  Pulse: (!) 59 88  Resp: (!) 9 13  Temp: 36.8 C 36.8 C    Last Pain:  Vitals:   08/24/16 1649  TempSrc: Oral  PainSc:                  Sandi Towe COKER

## 2016-08-24 NOTE — Progress Notes (Signed)
Day of Surgery  Subjective: CC in pre-op  Objective: Vital signs in last 24 hours: Temp:  [97.7 F (36.5 C)-98.8 F (37.1 C)] 98.6 F (37 C) (04/17 0527) Pulse Rate:  [60-90] 65 (04/17 0527) Resp:  [14-20] 20 (04/17 1100) BP: (93-122)/(46-78) 100/52 (04/17 0527) SpO2:  [99 %-100 %] 100 % (04/17 0527) Last BM Date: 08/21/16  Intake/Output from previous day: 04/16 0701 - 04/17 0700 In: 1311.7 [P.O.:600; I.V.:511.7; IV Piggyback:200] Out: 900 [Urine:900] Intake/Output this shift: No intake/output data recorded.  General appearance: alert and cooperative Resp: clear to auscultation bilaterally Cardio: regular rate and rhythm GI: soft, min TTP RUQ  Lab Results:   Recent Labs  08/23/16 0357 08/24/16 0432  WBC 4.8 5.5  HGB 11.0* 10.9*  HCT 32.6* 32.2*  PLT 234 233   BMET  Recent Labs  08/23/16 0357 08/24/16 0432  NA 140 139  K 3.5 3.9  CL 107 107  CO2 25 27  GLUCOSE 82 96  BUN <5* 5*  CREATININE 0.56 0.62  CALCIUM 8.2* 8.5*   PT/INR  Recent Labs  08/23/16 0357  LABPROT 14.9  INR 1.17   ABG No results for input(s): PHART, HCO3 in the last 72 hours.  Invalid input(s): PCO2, PO2  Studies/Results: Dg Ercp Biliary & Pancreatic Ducts  Result Date: 08/23/2016 CLINICAL DATA:  Choledocholithiasis EXAM: INTRAOPERATIVE CHOLANGIOGRAM TECHNIQUE: Cholangiographic images from the C-arm fluoroscopic device were submitted for interpretation post-operatively. Please see the procedural report for the amount of contrast and the fluoroscopy time utilized. COMPARISON:  None available FINDINGS: Limited spot fluoroscopic imaging during the ERCP. This demonstrates retrograde injection of the biliary tree. Contrast refluxes into the patent cystic duct. Numerous gallstones fill the gallbladder. Several small filling defects throughout the common hepatic duct and common bile duct compatible with choledocholithiasis. Sphincterotomy and balloon sweep performed. Following this, the  bile duct appears patent. Mild dilatation of the common bile duct. No stricture or focal narrowing. IMPRESSION: Choledocholithiasis successfully treated with sphincterotomy and balloon sweep. Cholelithiasis as well. Electronically Signed   By: Judie Petit.  Shick M.D.   On: 08/23/2016 15:54    Anti-infectives: Anti-infectives    Start     Dose/Rate Route Frequency Ordered Stop   08/23/16 1200  ciprofloxacin (CIPRO) IVPB 400 mg     400 mg 200 mL/hr over 60 Minutes Intravenous  Once 08/23/16 1148 08/23/16 1438      Assessment/Plan: Cholelithiasis and recent choledocholithiasis - laparoscopic cholecystectomy. Procedure, risks, and benefits were again discussed with her. I discussed the expected postoperative course and answered her questions.  LOS: 1 day    Jabri Blancett E 08/24/2016

## 2016-08-24 NOTE — Progress Notes (Signed)
Patient's VS at 0207 BP 93/46, PR 65, T98.8 oral, RR 19 & O2Sat=100% on RA. Floor coverage [facility] was made aware since patient was NPO midnight, for lap chole 4/17;1315 and no IVF running.  Patient not in distress. Floor coverage [facility] was paged earlier for any IVF order at 2355 and no response.

## 2016-08-24 NOTE — Anesthesia Postprocedure Evaluation (Addendum)
Anesthesia Post Note  Patient: Autumn Dixon  Procedure(s) Performed: Procedure(s) (LRB): ENDOSCOPIC RETROGRADE CHOLANGIOPANCREATOGRAPHY (ERCP) (N/A)  Patient location during evaluation: PACU Anesthesia Type: General Level of consciousness: sedated Pain management: pain level controlled Vital Signs Assessment: post-procedure vital signs reviewed and stable Respiratory status: spontaneous breathing and respiratory function stable Cardiovascular status: stable Anesthetic complications: no                   Kawanda Drumheller DANIEL

## 2016-08-24 NOTE — Transfer of Care (Signed)
Immediate Anesthesia Transfer of Care Note  Patient: Autumn Dixon  Procedure(s) Performed: Procedure(s): LAPAROSCOPIC CHOLECYSTECTOMY (N/A)  Patient Location: PACU  Anesthesia Type:General  Level of Consciousness: awake, alert , oriented and patient cooperative  Airway & Oxygen Therapy: Patient Spontanous Breathing and Patient connected to nasal cannula oxygen  Post-op Assessment: Report given to RN and Post -op Vital signs reviewed and stable  Post vital signs: Reviewed and stable  Last Vitals:  Vitals:   08/24/16 1100 08/24/16 1536  BP:    Pulse:  86  Resp: 20 14  Temp:  (P) 36.7 C    Last Pain:  Vitals:   08/24/16 1100  TempSrc: Oral  PainSc:          Complications: No apparent anesthesia complications

## 2016-08-24 NOTE — Progress Notes (Signed)
Patient ID: Autumn Dixon, female   DOB: [birth year], 41 y.o.   MRN: [id number]  PROGRESS NOTE    Autumn Dixon  YNW:[id number] DOB: [birth year] DOA: 08/22/2016 PCP: Elvina Sidle, MD   Brief Narrative: 41 y.o.femalewith medical history significant for anxiety, hypertension, and fibromyalgia who presented to the emergency department for evaluation of episodic epigastric pain. She was diagnosed with cholelithiasis and choledocholithiasis in outside hospital. She was admitted here with biliary colic. She had ERCP done yesterday and she is planned for cholecystectomy today.  Assessment & Plan:   Principal Problem:   Cholelithiasis with choledocholithiasis Active Problems:   Anxiety state   Biliary colic   Palpitations   Abdominal pain, chronic, epigastric   LFT elevation   Choledocholithiasis  1. Choledocholithiasis and cholelithiasis with biliary colic and acute transaminitis:  - Status post ERCP and stone removal yesterday - for cholecystectomy planned by general surgery today. continue NPO - no signs of biliary infection currently. Monitor off antibiotics - repeat labs in AM. Probable discharge in the next 24 hours depending on the postoperative course.   2. Anxiety disorder  - Appears stable  - Continue prn Xanax   3. Palpitations - Evaluated by cardiology in Feb '18, attributed to PVC's  - Improved with Lopressor, will continue   4. Hypokalemia  - improved    DVT prophylaxis: SCDs, hold lovenox for probable surgery Code Status:Full  Family Communication:none present at bedside Disposition Plan:Home in the next 24 hours depending on the postoperative course. Consults called:Gastroenterology; General surgery   Procedures:  ERCP and stone extraction done on 08/23/2016. Cholecystectomy planned for today    Antimicrobials :None  Subjective: Patient seen and examined at bedside. She is currently denying any abdominal pain nausea vomiting or  fever.  Objective: Vitals:   08/23/16 2038 08/24/16 0207 08/24/16 0527 08/24/16 1100  BP: 110/63 (!) 93/46 (!) 100/52   Pulse: 70 65 65   Resp: Temp: 98.8 F (37.1 C) 98.8 F (37.1 C) 98.6 F (37 C)   TempSrc: Oral Oral Oral Oral  SpO2: 100% 100% 100%   Weight:      Height:        Intake/Output Summary (Last 24 hours) at 08/24/16 1423 Last data filed at 08/24/16 0530  Gross per 24 hour  Intake          1311.67 ml  Output              900 ml  Net           411.67 ml   Filed Weights   08/22/16 1744 08/22/16 2222  Weight: 81.6 kg (180 lb) 81.6 kg (180 lb)    Examination:  General exam: Appears calm and comfortable  Respiratory system: Clear to auscultation. Respiratory effort normal. Cardiovascular system: S1 & S2 heard, RRR. No JVD, murmurs, rubs, gallops or clicks. No pedal edema. Gastrointestinal system: Abdomen is nondistended, soft and nontender. No organomegaly or masses felt. Normal bowel sounds heard. Extremities: no cyanosis, clubbing and edema     Data Reviewed: I have personally reviewed following labs and imaging studies  CBC:  Recent Labs Lab 08/22/16 1800 08/23/16 0357 08/24/16 0432  WBC 5.0 4.8 5.5  NEUTROABS  --  2.3 3.0  HGB 11.7* 11.0* 10.9*  HCT 35.5* 32.6* 32.2*  MCV 92.2 92.9 92.0  PLT 281 234 233   Basic Metabolic Panel:  Recent Labs Lab 08/22/16 1800 08/23/16 0357 08/24/16 0432  NA 136 140 139  K 3.4* 3.5 3.9  CL 100* 107 107  CO2 GLUCOSE 120* 82 96  BUN <5* <5* 5*  CREATININE 0.61 0.56 0.62  CALCIUM 9.0 8.2* 8.5*  MG  --  1.8  --    GFR: Estimated Creatinine Clearance: 103.1 mL/min (by C-G formula based on SCr of 0.62 mg/dL). Liver Function Tests:  Recent Labs Lab 08/22/16 1800 08/23/16 0357 08/24/16 0432  AST 112* 189* 65*  ALT 200* 274* 179*  ALKPHOS 75 87 74  BILITOT 1.2 1.1 0.6  PROT 6.0* 5.0* 5.1*  ALBUMIN 3.8 3.1* 3.1*    Recent Labs Lab 08/22/16 1800 08/23/16 0357  LIPASE  27 23   No results for input(s): AMMONIA in the last 168 hours. Coagulation Profile:  Recent Labs Lab 08/23/16 0357  INR 1.17   Cardiac Enzymes: No results for input(s): CKTOTAL, CKMB, CKMBINDEX, TROPONINI in the last 168 hours. BNP (last 3 results) No results for input(s): PROBNP in the last 8760 hours. HbA1C: No results for input(s): HGBA1C in the last 72 hours. CBG: No results for input(s): GLUCAP in the last 168 hours. Lipid Profile: No results for input(s): CHOL, HDL, LDLCALC, TRIG, CHOLHDL, LDLDIRECT in the last 72 hours. Thyroid Function Tests: No results for input(s): TSH, T4TOTAL, FREET4, T3FREE, THYROIDAB in the last 72 hours. Anemia Panel: No results for input(s): VITAMINB12, FOLATE, FERRITIN, TIBC, IRON, RETICCTPCT in the last 72 hours. Sepsis Labs: No results for input(s): PROCALCITON, LATICACIDVEN in the last 168 hours.  Recent Results (from the past 240 hour(s))  Surgical pcr screen     Status: None   Collection Time: 08/23/16 12:16 AM  Result Value Ref Range Status   MRSA, PCR NEGATIVE NEGATIVE Final   Staphylococcus aureus NEGATIVE NEGATIVE Final    Comment:        The Xpert SA Assay (FDA approved for NASAL specimens in patients over 57 years of age), is one component of a comprehensive surveillance program.  Test performance has been validated by [facility] for patients greater than or equal to 53 year old. It is not intended to diagnose infection nor to guide or monitor treatment.          Radiology Studies: Dg Ercp Biliary & Pancreatic Ducts  Result Date: 08/23/2016 CLINICAL DATA:  Choledocholithiasis EXAM: INTRAOPERATIVE CHOLANGIOGRAM TECHNIQUE: Cholangiographic images from the C-arm fluoroscopic device were submitted for interpretation post-operatively. Please see the procedural report for the amount of contrast and the fluoroscopy time utilized. COMPARISON:  None available FINDINGS: Limited spot fluoroscopic imaging during the ERCP. This  demonstrates retrograde injection of the biliary tree. Contrast refluxes into the patent cystic duct. Numerous gallstones fill the gallbladder. Several small filling defects throughout the common hepatic duct and common bile duct compatible with choledocholithiasis. Sphincterotomy and balloon sweep performed. Following this, the bile duct appears patent. Mild dilatation of the common bile duct. No stricture or focal narrowing. IMPRESSION: Choledocholithiasis successfully treated with sphincterotomy and balloon sweep. Cholelithiasis as well. Electronically Signed   By: Judie Petit.  Shick M.D.   On: 08/23/2016 15:54        Scheduled Meds: . [MAR Hold] colchicine  0.6 mg Oral Once  . [MAR Hold] metoprolol tartrate  12.5 mg Oral BID   Continuous Infusions: . dextrose 5 % and 0.9% NaCl 50 mL/hr at 08/24/16 0308  . lactated ringers 10 mL/hr at 08/24/16 1221     LOS: 1 day        Glade Lloyd, MD [facility] Pager [phone]  If 7PM-7AM, please contact night-coverage [email or web address] Password [facility] 08/24/2016, 2:23 PM

## 2016-08-24 NOTE — Anesthesia Procedure Notes (Addendum)
Procedure Name: Intubation Date/Time: 08/24/2016 2:06 PM Performed by: Oletta Lamas Pre-anesthesia Checklist: Patient identified, Emergency Drugs available, Suction available and Patient being monitored Patient Re-evaluated:Patient Re-evaluated prior to inductionOxygen Delivery Method: Circle System Utilized Preoxygenation: Pre-oxygenation with 100% oxygen Intubation Type: IV induction Ventilation: Mask ventilation without difficulty Laryngoscope Size: Mac and 3 Grade View: Grade II Tube type: Oral Number of attempts: 1 Airway Equipment and Method: Stylet Placement Confirmation: ETT inserted through vocal cords under direct vision,  positive ETCO2 and breath sounds checked- equal and bilateral Secured at: 22 cm Tube secured with: Tape Dental Injury: Teeth and Oropharynx as per pre-operative assessment

## 2016-08-25 ENCOUNTER — Encounter (HOSPITAL_COMMUNITY): Payer: Self-pay | Admitting: General Surgery

## 2016-08-25 DIAGNOSIS — K8070 Calculus of gallbladder and bile duct without cholecystitis without obstruction: Secondary | ICD-10-CM | POA: Diagnosis not present

## 2016-08-25 DIAGNOSIS — R1013 Epigastric pain: Secondary | ICD-10-CM | POA: Diagnosis not present

## 2016-08-25 DIAGNOSIS — K5900 Constipation, unspecified: Secondary | ICD-10-CM | POA: Diagnosis present

## 2016-08-25 DIAGNOSIS — K8051 Calculus of bile duct without cholangitis or cholecystitis with obstruction: Secondary | ICD-10-CM | POA: Diagnosis not present

## 2016-08-25 DIAGNOSIS — G8929 Other chronic pain: Secondary | ICD-10-CM | POA: Diagnosis not present

## 2016-08-25 DIAGNOSIS — R002 Palpitations: Secondary | ICD-10-CM | POA: Diagnosis not present

## 2016-08-25 DIAGNOSIS — R7989 Other specified abnormal findings of blood chemistry: Secondary | ICD-10-CM | POA: Diagnosis not present

## 2016-08-25 DIAGNOSIS — K8050 Calculus of bile duct without cholangitis or cholecystitis without obstruction: Secondary | ICD-10-CM | POA: Diagnosis not present

## 2016-08-25 DIAGNOSIS — F411 Generalized anxiety disorder: Secondary | ICD-10-CM | POA: Diagnosis not present

## 2016-08-25 LAB — CBC WITH DIFFERENTIAL/PLATELET
Basophils Absolute: 0 10*3/uL (ref 0.0–0.1)
Basophils Relative: 0 %
Eosinophils Absolute: 0.1 10*3/uL (ref 0.0–0.7)
Eosinophils Relative: 1 %
HCT: 31.4 % — ABNORMAL LOW (ref 36.0–46.0)
Hemoglobin: 10.7 g/dL — ABNORMAL LOW (ref 12.0–15.0)
Lymphocytes Relative: 24 %
Lymphs Abs: 2.1 10*3/uL (ref 0.7–4.0)
MCH: 31.8 pg (ref 26.0–34.0)
MCHC: 34.1 g/dL (ref 30.0–36.0)
MCV: 93.5 fL (ref 78.0–100.0)
Monocytes Absolute: 0.6 10*3/uL (ref 0.1–1.0)
Monocytes Relative: 7 %
Neutro Abs: 5.8 10*3/uL (ref 1.7–7.7)
Neutrophils Relative %: 68 %
Platelets: 242 10*3/uL (ref 150–400)
RBC: 3.36 MIL/uL — ABNORMAL LOW (ref 3.87–5.11)
RDW: 12.8 % (ref 11.5–15.5)
WBC: 8.6 10*3/uL (ref 4.0–10.5)

## 2016-08-25 LAB — COMPREHENSIVE METABOLIC PANEL
ALT: 136 U/L — ABNORMAL HIGH (ref 14–54)
AST: 43 U/L — ABNORMAL HIGH (ref 15–41)
Albumin: 3.1 g/dL — ABNORMAL LOW (ref 3.5–5.0)
Alkaline Phosphatase: 72 U/L (ref 38–126)
Anion gap: 5 (ref 5–15)
BUN: <5 mg/dL — ABNORMAL LOW (ref 6–20)
CO2: 29 mmol/L (ref 22–32)
Calcium: 8.6 mg/dL — ABNORMAL LOW (ref 8.9–10.3)
Chloride: 105 mmol/L (ref 101–111)
Creatinine, Ser: 0.57 mg/dL (ref 0.44–1.00)
GFR calc Af Amer: >60 mL/min (ref >60)
GFR calc non Af Amer: >60 mL/min (ref >60)
Glucose, Bld: 115 mg/dL — ABNORMAL HIGH (ref 65–99)
Potassium: 3.8 mmol/L (ref 3.5–5.1)
Sodium: 139 mmol/L (ref 135–145)
Total Bilirubin: 0.5 mg/dL (ref 0.3–1.2)
Total Protein: 5.2 g/dL — ABNORMAL LOW (ref 6.5–8.1)

## 2016-08-25 LAB — MAGNESIUM: Magnesium: 1.7 mg/dL (ref 1.7–2.4)

## 2016-08-25 MED ORDER — SENNOSIDES-DOCUSATE SODIUM 8.6-50 MG PO TABS
1.0000 | ORAL_TABLET | Freq: Two times a day (BID) | ORAL | Status: DC
  Administered 2016-08-25 – 2016-08-26 (×2): 1 via ORAL
  Filled 2016-08-25 (×3): qty 1

## 2016-08-25 MED ORDER — OXYCODONE HCL 5 MG PO TABS: 5.0000 mg | ORAL_TABLET | Freq: Four times a day (QID) | ORAL | Status: DC | PRN

## 2016-08-25 MED ORDER — SORBITOL 70 % SOLN
30.0000 mL | Freq: Once | Status: AC
  Administered 2016-08-25: 30 mL via ORAL
  Filled 2016-08-25: qty 30

## 2016-08-25 MED ORDER — GI COCKTAIL ~~LOC~~
30.0000 mL | Freq: Once | ORAL | Status: AC
  Administered 2016-08-25: 30 mL via ORAL
  Filled 2016-08-25: qty 30

## 2016-08-25 MED ORDER — GI COCKTAIL ~~LOC~~: 30.0000 mL | Freq: Three times a day (TID) | ORAL | Status: DC | PRN

## 2016-08-25 MED ORDER — MAGNESIUM SULFATE 4 GM/100ML IV SOLN
4.0000 g | Freq: Once | INTRAVENOUS | Status: AC
  Administered 2016-08-25: 4 g via INTRAVENOUS
  Filled 2016-08-25: qty 100

## 2016-08-25 MED ORDER — OXYCODONE HCL 5 MG PO TABS
5.0000 mg | ORAL_TABLET | ORAL | Status: DC | PRN
  Administered 2016-08-25 – 2016-08-26 (×5): 10 mg via ORAL
  Filled 2016-08-25 (×5): qty 2

## 2016-08-25 MED ORDER — COLCHICINE 0.6 MG PO TABS
0.6000 mg | ORAL_TABLET | Freq: Two times a day (BID) | ORAL | Status: DC
  Administered 2016-08-25 – 2016-08-26 (×2): 0.6 mg via ORAL
  Filled 2016-08-25 (×2): qty 1

## 2016-08-25 MED ORDER — SIMETHICONE 80 MG PO CHEW
160.0000 mg | CHEWABLE_TABLET | Freq: Four times a day (QID) | ORAL | Status: DC
  Administered 2016-08-25 – 2016-08-26 (×2): 160 mg via ORAL
  Filled 2016-08-25 (×3): qty 2

## 2016-08-25 MED ORDER — OXYCODONE HCL 5 MG PO TABS: 5.0000 mg | ORAL_TABLET | ORAL | 0 refills | Status: DC | PRN

## 2016-08-25 MED ORDER — POLYETHYLENE GLYCOL 3350 17 G PO PACK
17.0000 g | PACK | Freq: Two times a day (BID) | ORAL | Status: DC
  Administered 2016-08-25 – 2016-08-26 (×2): 17 g via ORAL
  Filled 2016-08-25 (×3): qty 1

## 2016-08-25 NOTE — Progress Notes (Signed)
Assessment Principal Problem:   Cholelithiasis with choledocholithiasis s/p lap chole 4/17 and ERCP 4/16-doing okay.   Plan:  Ok for discharge from our standpoint.  Instructions given to her.  Analgesic prescription written.   LOS: 1 day     1 Day Post-Op  Chief Complaint/Subjective: Sore in lower abdomen.  Objective: Vital signs in last 24 hours: Temp:  [98.1 F (36.7 C)-99.8 F (37.7 C)] 98.9 F (37.2 C) (04/18 0636) Pulse Rate:  [59-88] 65 (04/18 0636) Resp:  [8-20] 18 (04/18 0636) BP: (96-109)/(53-83) 98/54 (04/18 0636) SpO2:  [93 %-100 %] 99 % (04/18 0636) Last BM Date: 08/21/16  Intake/Output from previous day: 04/17 0701 - 04/18 0700 In: 2129.3 [P.O.:1060; I.V.:1069.3] Out: 670 [Urine:650; Blood:20] Intake/Output this shift: No intake/output data recorded.  PE: General- In NAD.  Awake and alert. Abdomen-soft, no significant tenderness, wounds clean and intact  Lab Results:   Recent Labs  08/23/16 0357 08/24/16 0432  WBC 4.8 5.5  HGB 11.0* 10.9*  HCT 32.6* 32.2*  PLT 234 233   BMET  Recent Labs  08/23/16 0357 08/24/16 0432  NA 140 139  K 3.5 3.9  CL 107 107  CO2 25 27  GLUCOSE 82 96  BUN <5* 5*  CREATININE 0.56 0.62  CALCIUM 8.2* 8.5*   PT/INR  Recent Labs  08/23/16 0357  LABPROT 14.9  INR 1.17   Comprehensive Metabolic Panel:    Component Value Date/Time   NA 139 08/24/2016 0432   NA 140 08/23/2016 0357   K 3.9 08/24/2016 0432   K 3.5 08/23/2016 0357   CL 107 08/24/2016 0432   CL 107 08/23/2016 0357   CO2 27 08/24/2016 0432   CO2 25 08/23/2016 0357   BUN 5 (L) 08/24/2016 0432   BUN <5 (L) 08/23/2016 0357   CREATININE 0.62 08/24/2016 0432   CREATININE 0.56 08/23/2016 0357   CREATININE 0.59 07/15/2015 1120   CREATININE 0.58 01/30/2014 1141   GLUCOSE 96 08/24/2016 0432   GLUCOSE 82 08/23/2016 0357   CALCIUM 8.5 (L) 08/24/2016 0432   CALCIUM 8.2 (L) 08/23/2016 0357   AST 65 (H) 08/24/2016 0432   AST 189 (H) 08/23/2016  0357   ALT 179 (H) 08/24/2016 0432   ALT 274 (H) 08/23/2016 0357   ALKPHOS 74 08/24/2016 0432   ALKPHOS 87 08/23/2016 0357   BILITOT 0.6 08/24/2016 0432   BILITOT 1.1 08/23/2016 0357   PROT 5.1 (L) 08/24/2016 0432   PROT 5.0 (L) 08/23/2016 0357   ALBUMIN 3.1 (L) 08/24/2016 0432   ALBUMIN 3.1 (L) 08/23/2016 0357     Studies/Results: Dg Ercp Biliary & Pancreatic Ducts  Result Date: 08/23/2016 CLINICAL DATA:  Choledocholithiasis EXAM: INTRAOPERATIVE CHOLANGIOGRAM TECHNIQUE: Cholangiographic images from the C-arm fluoroscopic device were submitted for interpretation post-operatively. Please see the procedural report for the amount of contrast and the fluoroscopy time utilized. COMPARISON:  None available FINDINGS: Limited spot fluoroscopic imaging during the ERCP. This demonstrates retrograde injection of the biliary tree. Contrast refluxes into the patent cystic duct. Numerous gallstones fill the gallbladder. Several small filling defects throughout the common hepatic duct and common bile duct compatible with choledocholithiasis. Sphincterotomy and balloon sweep performed. Following this, the bile duct appears patent. Mild dilatation of the common bile duct. No stricture or focal narrowing. IMPRESSION: Choledocholithiasis successfully treated with sphincterotomy and balloon sweep. Cholelithiasis as well. Electronically Signed   By: Judie Petit.  Shick M.D.   On: 08/23/2016 15:54    Anti-infectives: Anti-infectives    Start  Dose/Rate Route Frequency Ordered Stop   08/24/16 1330  ciprofloxacin (CIPRO) IVPB 400 mg     400 mg 200 mL/hr over 60 Minutes Intravenous  Once 08/24/16 1327 08/24/16 1411   08/23/16 1200  ciprofloxacin (CIPRO) IVPB 400 mg     400 mg 200 mL/hr over 60 Minutes Intravenous  Once 08/23/16 1148 08/23/16 1438       Brandee Markin J 08/25/2016

## 2016-08-25 NOTE — Discharge Instructions (Signed)
[facility] ______[facility] °LAPAROSCOPIC CHOLECYSTECTOMY SURGERY: POST OP INSTRUCTIONS °Always review your discharge instruction sheet given to you by the facility where your surgery was performed. °IF YOU HAVE DISABILITY OR FAMILY LEAVE FORMS, YOU MUST BRING THEM TO THE OFFICE FOR PROCESSING.   °DO NOT GIVE THEM TO YOUR DOCTOR. ° °1. A prescription for pain medication may be given to you upon discharge.  Take your pain medication as prescribed, if needed.  If narcotic pain medicine is not needed, then you may take acetaminophen (Tylenol) or ibuprofen (Advil) as needed. °2. Take your usually prescribed medications unless otherwise directed. °3. If you need a refill on your pain medication, please contact your pharmacy.  They will contact our office to request authorization. Prescriptions will not be filled after 5pm or on week-ends. °4. You should follow a light diet the first few days after arrival home, such as soup and crackers, etc.  Be sure to include lots of fluids daily.  May start lowfat, solid foods 2 days after the surgery. °5. Most patients will experience some swelling and bruising in the area of the incisions.  Ice packs will help.  Swelling and bruising can take several days to resolve.  °6. It is common to experience some constipation if taking pain medication after surgery.  Increasing fluid intake and taking a stool softener (such as Colace) will usually help or prevent this problem from occurring.  A mild laxative (Milk of Magnesia or Miralax) should be taken according to package instructions if there are no bowel movements after 48 hours. °7. Unless discharge instructions indicate otherwise, you may remove your bandages 72 hours after surgery.  You may shower the day after surgery.  You may have steri-strips (small skin tapes) in place directly over the incision.  These strips should be left on the skin until they fall off.  If your surgeon used skin glue on the incision, you may  shower in 24 hours.  The glue will flake off over the next 2-3 weeks.  Any sutures or staples will be removed at the office during your follow-up visit. °8. ACTIVITIES:  You may resume regular (light) daily activities beginning the next day--such as daily self-care, walking, climbing stairs--gradually increasing activities as tolerated.  You may have sexual intercourse when it is comfortable.  Refrain from any heavy lifting or straining for two weeks.  Do not lift anything over 10 pounds during that time.  °a. You may drive when you are no longer taking prescription pain medication, you can comfortably wear a seatbelt, and you can safely maneuver your car and apply brakes. °b. RETURN TO WORK:  Desk type work in 1 week, full duty work in 2 weeks if you are pain-free.________________________________________________________ °9. You should see your doctor in the office for a follow-up appointment approximately 2-3 weeks after your surgery.  Make sure that you call for this appointment within a day or two after you arrive home to insure a convenient appointment time. °10. OTHER INSTRUCTIONS: __________________________________________________________________________________________________________________________ __________________________________________________________________________________________________________________________ °WHEN TO CALL YOUR DOCTOR: °1. Fever over 101.0 °2. Inability to urinate °3. Continued bleeding from incision. °4. Increased pain, redness, or drainage from the incision. °5. Increasing abdominal pain ° °The clinic staff is available to answer your questions during regular business hours.  Please don’t hesitate to call and ask to speak to one of the nurses for clinical concerns.  If you have a medical emergency, go to the nearest emergency room or call 911.  A surgeon from   [facility] is always on call at the hospital. °[address], [address], [place], [place]  [address] ?  [address] [address], [place], [place]   [address] °[phone] ? [phone] ? FAX [phone] °Web site: [email or web address] ° °

## 2016-08-25 NOTE — Progress Notes (Signed)
PROGRESS NOTE    Autumn Dixon  ZOX:[id number] DOB: 1975-10-24 DOA: 08/22/2016 PCP: Elvina Sidle, MD    Brief Narrative:  41 y.o.femalewith medical history significant for anxiety, hypertension, and fibromyalgia who presented tothe emergency department for evaluation of episodic epigastric pain. She was diagnosed with cholelithiasis and choledocholithiasis in outside hospital. She was admitted here with biliary colic. She had ERCP done yesterday and she is planned for cholecystectomy today.   Assessment & Plan:   Principal Problem:   Cholelithiasis with choledocholithiasis Active Problems:   Anxiety state   Biliary colic   Palpitations   Abdominal pain, chronic, epigastric   LFT elevation   Choledocholithiasis   Constipation  #1 choledocholithiasis and cholelithiasis with biliary colic/acute transaminitis Status post ERCP with stone removal per Dr.Danis III 08/23/2016. Patient status post cholecystectomy per Dr. Violeta Gelinas general surgery 08/24/2016. Patient with clinical improvement. LFTs trending down. General surgery following.  #2 constipation Patient with complaints of no flatus postoperatively and no bowel movement since Friday, 08/20/2016. Patient noted also to have been on narcotic pain medication during hospitalization. Patient states she feels very bloated. Will place on scheduled simethicone, MiraLAX 17 g twice a day, Senokot S twice a day, sorbitol by mouth 1. If no bowel movement and no flatus in 24 hours will get a KUB to rule out postop ileus. Follow.  #3 anxiety disorder Stable. Xanax as needed.  #4 palpitations Patient has been evaluated per cardiology in February 2018 and palpitations activated to PVCs. Improved on Lopressor. Continue home regimen Lopressor.  #5 hypokalemia Keep magnesium greater than 2. Follow.     DVT prophylaxis: SCDs Code Status: Full Family Communication: Updated patient. No family at bedside. Disposition Plan: Home  hopefully tomorrow, once constipation is resolved and no further complications.   Consultants:   Gen. surgery: Dr. Violeta Gelinas 08/23/2016  Gastroenterology: Dr. Myrtie Neither III 08/23/2016  Procedures:   ERCP with removal of cartilaginous from biliary/pancreatic ducts with sphincterectomy/papillotomy Dr. Myrtie Neither III 08/23/2016  Laparoscopic cholecystectomy 08/24/2016 per Dr. Violeta Gelinas  Antimicrobials:   None   Subjective: Patient denies any shortness of breath. Patient complaining of a bloated feeling and is feeling gassy the patient denies any flatus. Patient states hasn't had a bowel movement since Friday, 08/20/2016. Patient denies any nausea or emesis.  Objective: Vitals:   08/25/16 0228 08/25/16 0636 08/25/16 1054 08/25/16 1300  BP: (!) 96/54 (!) 98/54 125/60 (!) 109/57  Pulse: 64 65 66 74  Resp: 17 18    Temp: 98.7 F (37.1 C) 98.9 F (37.2 C)  98.9 F (37.2 C)  TempSrc: Oral Oral  Oral  SpO2: 98% 99%  99%  Weight:      Height:        Intake/Output Summary (Last 24 hours) at 08/25/16 1856 Last data filed at 08/25/16 1500  Gross per 24 hour  Intake             1518 ml  Output              650 ml  Net              868 ml   Filed Weights   08/22/16 1744 08/22/16 2222  Weight: 81.6 kg (180 lb) 81.6 kg (180 lb)    Examination:  General exam: Appears calm and comfortable  Respiratory system: Clear to auscultation. Respiratory effort normal. Cardiovascular system: S1 & S2 heard, RRR. No JVD, murmurs, rubs, gallops or clicks. No pedal edema. Gastrointestinal system: Abdomen is nondistended,  soft and nontender. No organomegaly or masses felt. Normal bowel sounds heard. Central nervous system: Alert and oriented. No focal neurological deficits. Extremities: Symmetric 5 x 5 power. Skin: No rashes, lesions or ulcers Psychiatry: Judgement and insight appear normal. Mood & affect appropriate.     Data Reviewed: I have personally reviewed following labs and  imaging studies  CBC:  Recent Labs Lab 08/22/16 1800 08/23/16 0357 08/24/16 0432 08/25/16 0902  WBC 5.0 4.8 5.5 8.6  NEUTROABS  --  2.3 3.0 5.8  HGB 11.7* 11.0* 10.9* 10.7*  HCT 35.5* 32.6* 32.2* 31.4*  MCV 92.2 92.9 92.0 93.5  PLT 281 234 233 242   Basic Metabolic Panel:  Recent Labs Lab 08/22/16 1800 08/23/16 0357 08/24/16 0432 08/25/16 0902  NA 136 140 139 139  K 3.4* 3.5 3.9 3.8  CL 100* 107 107 105  CO2 GLUCOSE 120* 82 96 115*  BUN <5* <5* 5* <5*  CREATININE 0.61 0.56 0.62 0.57  CALCIUM 9.0 8.2* 8.5* 8.6*  MG  --  1.8  --  1.7   GFR: Estimated Creatinine Clearance: 103.1 mL/min (by C-G formula based on SCr of 0.57 mg/dL). Liver Function Tests:  Recent Labs Lab 08/22/16 1800 08/23/16 0357 08/24/16 0432 08/25/16 0902  AST 112* 189* 65* 43*  ALT 200* 274* 179* 136*  ALKPHOS 75 87 74 72  BILITOT 1.2 1.1 0.6 0.5  PROT 6.0* 5.0* 5.1* 5.2*  ALBUMIN 3.8 3.1* 3.1* 3.1*    Recent Labs Lab 08/22/16 1800 08/23/16 0357  LIPASE 27 23   No results for input(s): AMMONIA in the last 168 hours. Coagulation Profile:  Recent Labs Lab 08/23/16 0357  INR 1.17   Cardiac Enzymes: No results for input(s): CKTOTAL, CKMB, CKMBINDEX, TROPONINI in the last 168 hours. BNP (last 3 results) No results for input(s): PROBNP in the last 8760 hours. HbA1C: No results for input(s): HGBA1C in the last 72 hours. CBG: No results for input(s): GLUCAP in the last 168 hours. Lipid Profile: No results for input(s): CHOL, HDL, LDLCALC, TRIG, CHOLHDL, LDLDIRECT in the last 72 hours. Thyroid Function Tests: No results for input(s): TSH, T4TOTAL, FREET4, T3FREE, THYROIDAB in the last 72 hours. Anemia Panel: No results for input(s): VITAMINB12, FOLATE, FERRITIN, TIBC, IRON, RETICCTPCT in the last 72 hours. Sepsis Labs: No results for input(s): PROCALCITON, LATICACIDVEN in the last 168 hours.  Recent Results (from the past 240 hour(s))  Surgical pcr screen      Status: None   Collection Time: 08/23/16 12:16 AM  Result Value Ref Range Status   MRSA, PCR NEGATIVE NEGATIVE Final   Staphylococcus aureus NEGATIVE NEGATIVE Final    Comment:        The Xpert SA Assay (FDA approved for NASAL specimens in patients over 62 years of age), is one component of a comprehensive surveillance program.  Test performance has been validated by [facility] for patients greater than or equal to 28 year old. It is not intended to diagnose infection nor to guide or monitor treatment.          Radiology Studies: No results found.      Scheduled Meds: . colchicine  0.6 mg Oral BID  . metoprolol tartrate  12.5 mg Oral BID  . polyethylene glycol  17 g Oral BID  . senna-docusate  1 tablet Oral BID  . simethicone  160 mg Oral QID   Continuous Infusions:    LOS: 1 day    Time spent: 40  minutes    Laylynn Campanella, MD [facility] Pager [phone]  If 7PM-7AM, please contact night-coverage [email or web address] Password [facility] 08/25/2016, 6:56 PM

## 2016-08-26 DIAGNOSIS — K8050 Calculus of bile duct without cholangitis or cholecystitis without obstruction: Secondary | ICD-10-CM | POA: Diagnosis not present

## 2016-08-26 DIAGNOSIS — R1013 Epigastric pain: Secondary | ICD-10-CM | POA: Diagnosis not present

## 2016-08-26 DIAGNOSIS — G8929 Other chronic pain: Secondary | ICD-10-CM

## 2016-08-26 DIAGNOSIS — K8051 Calculus of bile duct without cholangitis or cholecystitis with obstruction: Secondary | ICD-10-CM | POA: Diagnosis not present

## 2016-08-26 DIAGNOSIS — K8070 Calculus of gallbladder and bile duct without cholecystitis without obstruction: Secondary | ICD-10-CM | POA: Diagnosis not present

## 2016-08-26 LAB — COMPREHENSIVE METABOLIC PANEL
ALT: 157 U/L — ABNORMAL HIGH (ref 14–54)
AST: 82 U/L — ABNORMAL HIGH (ref 15–41)
Albumin: 3 g/dL — ABNORMAL LOW (ref 3.5–5.0)
Alkaline Phosphatase: 66 U/L (ref 38–126)
Anion gap: 5 (ref 5–15)
BUN: <5 mg/dL — ABNORMAL LOW (ref 6–20)
CO2: 30 mmol/L (ref 22–32)
Calcium: 8.2 mg/dL — ABNORMAL LOW (ref 8.9–10.3)
Chloride: 103 mmol/L (ref 101–111)
Creatinine, Ser: 0.55 mg/dL (ref 0.44–1.00)
GFR calc Af Amer: >60 mL/min (ref >60)
GFR calc non Af Amer: >60 mL/min (ref >60)
Glucose, Bld: 108 mg/dL — ABNORMAL HIGH (ref 65–99)
Potassium: 4.1 mmol/L (ref 3.5–5.1)
Sodium: 138 mmol/L (ref 135–145)
Total Bilirubin: 0.7 mg/dL (ref 0.3–1.2)
Total Protein: 5.1 g/dL — ABNORMAL LOW (ref 6.5–8.1)

## 2016-08-26 LAB — CBC WITH DIFFERENTIAL/PLATELET
Basophils Absolute: 0 10*3/uL (ref 0.0–0.1)
Basophils Relative: 0 %
Eosinophils Absolute: 0.1 10*3/uL (ref 0.0–0.7)
Eosinophils Relative: 1 %
HCT: 31.7 % — ABNORMAL LOW (ref 36.0–46.0)
Hemoglobin: 10.2 g/dL — ABNORMAL LOW (ref 12.0–15.0)
Lymphocytes Relative: 33 %
Lymphs Abs: 2.4 10*3/uL (ref 0.7–4.0)
MCH: 30.4 pg (ref 26.0–34.0)
MCHC: 32.2 g/dL (ref 30.0–36.0)
MCV: 94.3 fL (ref 78.0–100.0)
Monocytes Absolute: 0.6 10*3/uL (ref 0.1–1.0)
Monocytes Relative: 9 %
Neutro Abs: 4.2 10*3/uL (ref 1.7–7.7)
Neutrophils Relative %: 57 %
Platelets: 224 10*3/uL (ref 150–400)
RBC: 3.36 MIL/uL — ABNORMAL LOW (ref 3.87–5.11)
RDW: 12.8 % (ref 11.5–15.5)
WBC: 7.2 10*3/uL (ref 4.0–10.5)

## 2016-08-26 MED ORDER — BISACODYL 10 MG RE SUPP
10.0000 mg | Freq: Once | RECTAL | Status: AC
  Administered 2016-08-26: 10 mg via RECTAL
  Filled 2016-08-26: qty 1

## 2016-08-26 MED ORDER — SIMETHICONE 80 MG PO CHEW: 160.0000 mg | CHEWABLE_TABLET | Freq: Four times a day (QID) | ORAL | 0 refills | Status: DC | PRN

## 2016-08-26 MED ORDER — SENNOSIDES-DOCUSATE SODIUM 8.6-50 MG PO TABS: 1.0000 | ORAL_TABLET | Freq: Two times a day (BID) | ORAL | Status: DC

## 2016-08-26 MED ORDER — POLYETHYLENE GLYCOL 3350 17 G PO PACK: 17.0000 g | PACK | Freq: Two times a day (BID) | ORAL | 0 refills | Status: DC

## 2016-08-26 NOTE — Care Management Note (Signed)
Case Management Note  Patient Details  Name: Autumn Dixon MRN: [id number] Date of Birth: 09/08/1975  Subjective/Objective:                    Action/Plan:  No needs identified  Expected Discharge Date:  08/26/16               Expected Discharge Plan:  Home/Self Care  In-House Referral:     Discharge planning Services     Post Acute Care Choice:    Choice offered to:     DME Arranged:    DME Agency:     HH Arranged:    HH Agency:     Status of Service:  Completed, signed off  If discussed at [employer] of Stay Meetings, dates discussed:    Additional Comments:  Kingsley Plan, RN 08/26/2016, 2:33 PM

## 2016-08-26 NOTE — Discharge Summary (Signed)
Physician Discharge Summary  CORAH WILLEFORD ZOX:[id number] DOB: [birth year] DOA: 08/22/2016  PCP: Elvina Sidle, MD  Admit date: 08/22/2016 Discharge date: 08/26/2016  Time spent: 65 minutes  Recommendations for Outpatient Follow-up:  1. Follow-up with Elvina Sidle, MD in 3-4 weeks. On follow-up patient will need a comprehensive metabolic profile done to follow-up on electrolytes, renal function, LFTs. Patient's constipation will need to be reassessed. 2. Follow-up with general surgery in 2-3 weeks.   Discharge Diagnoses:  Principal Problem:   Cholelithiasis with choledocholithiasis Active Problems:   Anxiety state   Biliary colic   Palpitations   Abdominal pain, chronic, epigastric   LFT elevation   Choledocholithiasis   Constipation   Discharge Condition: Stable and improved.  Diet recommendation: Regular  Filed Weights   08/22/16 1744 08/22/16 2222  Weight: 81.6 kg (180 lb) 81.6 kg (180 lb)    History of present illness:  Per Dr Antionette Char : Autumn Dixon is a 41 y.o. female with medical history significant for anxiety, hypertension, and fibromyalgia who presented to the emergency department for evaluation of episodic epigastric pain. Patient reported that she had been in her usual state of health until 08/11/2016 when, shortly after a meal, she developed acute onset of severe, sharp, cramping pain localized to the epigastrium and associated with mild nausea. She reported that the episode resolved after 20-30 minutes, but she had a another similar episode later that day. Since that time, patient reported having 5 severe episodes, mainly postprandial, and all lasting approximately 20-30 minutes. She is also had some similar episodes that have been milder and resolve quicker. She denied any recent fevers or chills and denied any vomiting or diarrhea. There has not been any chest pain, and no dyspnea or cough. Patient denied any easy bruising or bleeding or yellowing of the skin or  eyes. In between the episodes, she is in her normal state and generally well. She was evaluated at an outside hospital for these complaints 1 day prior to admission, and had an ultrasound of right upper quadrant performed, revealing cholelithiasis and choledocholithiasis. She was advised that she would likely require surgery, but was out of town and elected to come back home to [place] to be evaluated here.  ED Course: Upon arrival to the ED, patient was found to be afebrile, saturating well on room air, and with vital signs stable. Chemistry panel featured a potassium of 3.4, AST of 112, ALT 200, and normal bilirubin and lipase. CBC was notable for a mild normocytic anemia with hemoglobin of 11.7. Patient was given a liter of normal saline and gastroenterology was consulted by the ED physician. GI advised for a medical admission with patient to be nothing by mouth after midnight for likely ERCP in the morning. Patient remained hemodynamically stable in the emergency department and has not been in any apparent respiratory distress. She will be admitted to the medical-surgical unit for ongoing evaluation and management of choledocholithiasis with biliary colic.   Hospital Course:  #1 choledocholithiasis and cholelithiasis with biliary colic/acute transaminitis Patient was admitted with abdominal pain and initial workup noted for transaminitis with normal bilirubin and lipase. Patient hydrated with IV fluids and GI consultation obtained as well as surgical consultation. Patient was seen by GI and patient underwent ERCP with stone removal per Dr.Danis III 08/23/2016. Patient subsequently underwent cholecystectomy per Dr. Violeta Gelinas, general surgery 08/24/2016. Patient with clinical improvement. LFTs trended down. Patient be discharged home in stable and improved condition and is to follow-up with general  surgery and outpatient setting. Patient is also to follow-up with PCP.   #2 constipation Patient  with complaints of no flatus postoperatively and no bowel movement since Friday, 08/20/2016. Patient noted also to have been on narcotic pain medication during hospitalization. Patient stated she felt very bloated. Patient was placed on scheduled simethicone, MiraLAX 17 g twice daily, Senokot S twice a day, sorbitol by mouth 1. Patient was also given a soapsuds enema with good results. Patient be discharged home on MiraLAX 17 g twice daily as well as Senokot S twice daily. Outpatient follow-up.   #3 anxiety disorder Stable. Xanax as needed.  #4 palpitations Patient has been evaluated per cardiology in February 2018 and palpitations attribute it to PVCs. Improved on Lopressor. Continued on home regimen Lopressor.  #5 hypokalemia Repleted during the hospitalization. Outpatient follow-up.   Procedures:  ERCP with removal of cartilaginous from biliary/pancreatic ducts with sphincterectomy/papillotomy Dr. Myrtie Neither III 08/23/2016  Laparoscopic cholecystectomy 08/24/2016 per Dr. Violeta Gelinas  Consultations:  Gen. surgery: Dr. Violeta Gelinas 08/23/2016  Gastroenterology: Dr. Myrtie Neither III 08/23/2016   Discharge Exam: Vitals:   08/25/16 2020 08/26/16 0438  BP: (!) 94/58 (!) 95/48  Pulse: 76 85  Resp: 18 18  Temp: 98.5 F (36.9 C) 99.2 F (37.3 C)    General: NAD Cardiovascular: RRR Respiratory: CTAB  Discharge Instructions   Discharge Instructions    Diet general    Complete by:  As directed    Soft diet   Increase activity slowly    Complete by:  As directed      Current Discharge Medication List    START taking these medications   Details  oxyCODONE (OXY IR/ROXICODONE) 5 MG immediate release tablet Take 1-2 tablets (5-10 mg total) by mouth every 4 (four) hours as needed for moderate pain or severe pain. Qty: 30 tablet, Refills: 0    polyethylene glycol (MIRALAX / GLYCOLAX) packet Take 17 g by mouth 2 (two) times daily. Qty: 14 each, Refills: 0    senna-docusate  (SENOKOT-S) 8.6-50 MG tablet Take 1 tablet by mouth 2 (two) times daily.    simethicone (MYLICON) 80 MG chewable tablet Chew 2 tablets (160 mg total) by mouth 4 (four) times daily as needed for flatulence. Qty: 30 tablet, Refills: 0      CONTINUE these medications which have NOT CHANGED   Details  alprazolam (XANAX) 2 MG tablet Take 2 mg by mouth at bedtime as needed for sleep or anxiety.    colchicine 0.6 MG tablet Take 1 tablet (0.6 mg total) by mouth 2 (two) times daily. Qty: 180 tablet, Refills: 0    ibuprofen (ADVIL,MOTRIN) 200 MG tablet Take 800 mg by mouth every 6 (six) hours as needed for headache or moderate pain.    metoprolol tartrate (LOPRESSOR) 25 MG tablet Take 0.5 tablets (12.5 mg total) by mouth 2 (two) times daily. Qty: 60 tablet, Refills: 3    sertraline (ZOLOFT) 100 MG tablet Take 1 tablet (100 mg total) by mouth daily. Qty: 90 tablet, Refills: 3   Associated Diagnoses: Insomnia       No Known Allergies Follow-up Information    [facility] Schedule an appointment as soon as possible for a visit.   Specialty:  General Surgery Why:  for post-operative follow up in 2-3 weeks from date of discharge Contact information: [address] [place] [address] [phone]       Elvina Sidle, MD. Schedule an appointment as soon as possible for  a visit in 3 week(s).   Specialty:  Family Medicine Why:  f/u in 3-4 weeks. Contact information: [address] [place] [address] [phone]            The results of significant diagnostics from this hospitalization (including imaging, microbiology, ancillary and laboratory) are listed below for reference.    Significant Diagnostic Studies: Dg Ercp Biliary & Pancreatic Ducts  Result Date: 08/23/2016 CLINICAL DATA:  Choledocholithiasis EXAM: INTRAOPERATIVE CHOLANGIOGRAM TECHNIQUE: Cholangiographic images from the C-arm fluoroscopic device were submitted  for interpretation post-operatively. Please see the procedural report for the amount of contrast and the fluoroscopy time utilized. COMPARISON:  None available FINDINGS: Limited spot fluoroscopic imaging during the ERCP. This demonstrates retrograde injection of the biliary tree. Contrast refluxes into the patent cystic duct. Numerous gallstones fill the gallbladder. Several small filling defects throughout the common hepatic duct and common bile duct compatible with choledocholithiasis. Sphincterotomy and balloon sweep performed. Following this, the bile duct appears patent. Mild dilatation of the common bile duct. No stricture or focal narrowing. IMPRESSION: Choledocholithiasis successfully treated with sphincterotomy and balloon sweep. Cholelithiasis as well. Electronically Signed   By: Judie Petit.  Shick M.D.   On: 08/23/2016 15:54    Microbiology: Recent Results (from the past 240 hour(s))  Surgical pcr screen     Status: None   Collection Time: 08/23/16 12:16 AM  Result Value Ref Range Status   MRSA, PCR NEGATIVE NEGATIVE Final   Staphylococcus aureus NEGATIVE NEGATIVE Final    Comment:        The Xpert SA Assay (FDA approved for NASAL specimens in patients over 90 years of age), is one component of a comprehensive surveillance program.  Test performance has been validated by [facility] for patients greater than or equal to 11 year old. It is not intended to diagnose infection nor to guide or monitor treatment.      Labs: Basic Metabolic Panel:  Recent Labs Lab 08/22/16 1800 08/23/16 0357 08/24/16 0432 08/25/16 0902 08/26/16 0418  NA 136 140 139 139 138  K 3.4* 3.5 3.9 3.8 4.1  CL 100* 107 107 105 103  CO2 GLUCOSE 120* 82 96 115* 108*  BUN <5* <5* 5* <5* <5*  CREATININE 0.61 0.56 0.62 0.57 0.55  CALCIUM 9.0 8.2* 8.5* 8.6* 8.2*  MG  --  1.8  --  1.7  --    Liver Function Tests:  Recent Labs Lab 08/22/16 1800 08/23/16 0357 08/24/16 0432 08/25/16 0902  08/26/16 0418  AST 112* 189* 65* 43* 82*  ALT 200* 274* 179* 136* 157*  ALKPHOS 75 87 74 72 66  BILITOT 1.2 1.1 0.6 0.5 0.7  PROT 6.0* 5.0* 5.1* 5.2* 5.1*  ALBUMIN 3.8 3.1* 3.1* 3.1* 3.0*    Recent Labs Lab 08/22/16 1800 08/23/16 0357  LIPASE 27 23   No results for input(s): AMMONIA in the last 168 hours. CBC:  Recent Labs Lab 08/22/16 1800 08/23/16 0357 08/24/16 0432 08/25/16 0902 08/26/16 0418  WBC 5.0 4.8 5.5 8.6 7.2  NEUTROABS  --  2.3 3.0 5.8 4.2  HGB 11.7* 11.0* 10.9* 10.7* 10.2*  HCT 35.5* 32.6* 32.2* 31.4* 31.7*  MCV 92.2 92.9 92.0 93.5 94.3  PLT 281 234 233 242 224   Cardiac Enzymes: No results for input(s): CKTOTAL, CKMB, CKMBINDEX, TROPONINI in the last 168 hours. BNP: BNP (last 3 results) No results for input(s): BNP in the last 8760 hours.  ProBNP (last 3 results) No results for  input(s): PROBNP in the last 8760 hours.  CBG: No results for input(s): GLUCAP in the last 168 hours.     SignedRamiro Harvest MD.  [facility] 08/26/2016, 12:55 PM

## 2016-09-08 ENCOUNTER — Telehealth: Payer: Self-pay | Admitting: Internal Medicine

## 2016-09-08 NOTE — Telephone Encounter (Signed)
I have advised patient that I am unable to refill nitroglycerin as she has not been seen in over 2 years. However she is scheduled to see Mike Gip, PA-C on 09/13/16 to discuss recurrent rectal pain to see if nitroglycerin is appropriate.

## 2016-09-13 ENCOUNTER — Encounter: Payer: Self-pay | Admitting: Physician Assistant

## 2016-09-13 ENCOUNTER — Ambulatory Visit (INDEPENDENT_AMBULATORY_CARE_PROVIDER_SITE_OTHER): Payer: BC Managed Care – PPO | Admitting: Physician Assistant

## 2016-09-13 VITALS — BP 100/66 | HR 67 | Ht 70.0 in | Wt 177.0 lb

## 2016-09-13 DIAGNOSIS — K602 Anal fissure, unspecified: Secondary | ICD-10-CM

## 2016-09-13 MED ORDER — "NITROGLYCERIN NICU 2% OINTMENT ": 1.0000 "application " | TOPICAL_OINTMENT | Freq: Three times a day (TID) | TRANSDERMAL | 3 refills | Status: DC

## 2016-09-13 NOTE — Progress Notes (Signed)
Subjective:    Patient ID: Autumn Dixon, female    DOB: 03-28-1976, 41 y.o.   MRN: [id number]  HPI Autumn Dixon is a very nice 41 year old white female known to Dr. Rhea Belton. She was last seen in our office in March 2016 at which time she was treated for a posterior  anal fissure. She could not tolerate lidocaine secondary to burning, diltiazem was not helpful but she had good results with nitroglycerin ointment. She has undergone recent lap scopic cholecystectomy on 08/24/2016 and had ERCP with removal of common bile duct stones per Dr. Myrtie Neither on 08/23/2016. She says she's recovering well from her surgeries and is not having any residual abdominal pain. She says her anal fissure symptoms started about a month ago and got worse around the time of her gallbladder surgery because she stopped her stools softeners which she had been taking regularly. She became constipated, had straining and now over the past 2 weeks has had significant anal pain, especially with bowel movements consistent with prior anal fissure. She says she is not having any bleeding. She has intentionally not had a bowel movement over the past 3 days for fear of pain. She had called asking for a refill of nitroglycerin ointment and was scheduled an office appointment. She says her symptoms feel exactly like previous anal fissure.  Review of Systems Pertinent positive and negative review of systems were noted in the above HPI section.  All other review of systems was otherwise negative.  Outpatient Encounter Prescriptions as of 09/13/2016  Medication Sig  . alprazolam (XANAX) 2 MG tablet Take 2 mg by mouth at bedtime as needed for sleep or anxiety.  Marland Kitchen ibuprofen (ADVIL,MOTRIN) 200 MG tablet Take 800 mg by mouth every 6 (six) hours as needed for headache or moderate pain.  . metoprolol tartrate (LOPRESSOR) 25 MG tablet Take 0.5 tablets (12.5 mg total) by mouth 2 (two) times daily.  . sertraline (ZOLOFT) 100 MG tablet Take 1 tablet (100 mg  total) by mouth daily.  . [DISCONTINUED] colchicine 0.6 MG tablet Take 1 tablet (0.6 mg total) by mouth 2 (two) times daily.  . [DISCONTINUED] oxyCODONE (OXY IR/ROXICODONE) 5 MG immediate release tablet Take 1-2 tablets (5-10 mg total) by mouth every 4 (four) hours as needed for moderate pain or severe pain.  . [DISCONTINUED] polyethylene glycol (MIRALAX / GLYCOLAX) packet Take 17 g by mouth 2 (two) times daily.  . [DISCONTINUED] senna-docusate (SENOKOT-S) 8.6-50 MG tablet Take 1 tablet by mouth 2 (two) times daily.  . [DISCONTINUED] simethicone (MYLICON) 80 MG chewable tablet Chew 2 tablets (160 mg total) by mouth 4 (four) times daily as needed for flatulence.  . nitroGLYCERIN (NITROGLYN) 2 % OINT ointment Apply 1 application topically 3 (three) times daily.   No facility-administered encounter medications on file as of 09/13/2016.    No Known Allergies Patient Active Problem List   Diagnosis Date Noted  . Calculus of bile duct without cholecystitis with obstruction   . Constipation 08/25/2016  . Choledocholithiasis 08/23/2016  . Abdominal pain, chronic, epigastric   . LFT elevation   . Cholelithiasis with choledocholithiasis 08/22/2016  . Biliary colic 08/22/2016  . Palpitations   . Fibromyalgia   . Pericarditis   . Chest pain at rest 07/04/2016  . Family history of premature CAD 07/04/2016  . Anxiety state 12/24/2013  . Contraception 12/24/2013   Social History   Social History  . Marital status: Married    Spouse name: N/A  . Number of children: 4  .  Years of education: N/A   Occupational History  . secretary/school treasurer    Social History Main Topics  . Smoking status: Former Smoker    Years: 1.00    Types: Cigarettes    Quit date: 05/10/1997  . Smokeless tobacco: Never Used  . Alcohol use 0.6 oz/week    1 Glasses of wine per week     Comment: occasional  . Drug use: No  . Sexual activity: Not on file   Other Topics Concern  . Not on file   Social History  Narrative  . No narrative on file    Autumn Dixon's family history includes Colitis in her maternal grandmother; Colon cancer in her paternal grandfather; Colon polyps in her maternal grandmother and mother; Diabetes in her brother, maternal grandfather, and maternal grandmother; Heart attack in her paternal aunt and paternal uncle; Heart disease in her paternal aunt and paternal grandfather; Heart disease (age of onset: 2727) in her father; Irritable bowel syndrome in her maternal grandmother; Ovarian cysts in her mother and sister; Pancreatic cancer in her maternal grandfather.      Objective:    Vitals:   09/13/16 1533  BP: 100/66  Pulse: 67    Physical Exam  well-developed white female in no acute distress, very pleasant accompanied by her child, blood pressure 100/66 pulse 67. HEENT nontraumatic normocephalic EOMI PERRLA sclera anicteric, Not further examined today discussion only       Assessment & Plan:   #361 41 year old white female with history of posterior anal fissure who had undergone cholecystectomy and ERCP with removal of common bile duct stones about 2-1/2 weeks ago. She presents now with recurrent anal pain after becoming constipated around the time of her surgery. Symptoms are very similar to previous posterior fissure. #2 history of cholecystitis status post laparoscopic cholecystectomy for 4/17 2018 #3 choledocholithiasis-that is post ERCP and  stone extractions 08/23/2016  Plan; management of anal fissures reviewed with patient. Advise she try sitz baths once or twice daily Continue daily stool softener, MiraLAX as needed Offered 5% lidocaine which she declined Will start nitroglycerin ointment 2% apply 3 times daily for at least 1 week past the time of last symptoms. We discussed the slow nature of healing of anal fissures. She is asked to call back in 4 weeks if she has not had significant improvement. [facility]  but finally healed prior to that appointment.  Greater than 50% of visit spent in counseling on medical  Management.   Amy S Esterwood PA-C 09/13/2016   Cc: Elvina SidleLauenstein, Kurt, MD

## 2016-09-13 NOTE — Patient Instructions (Addendum)
If you are age 39 or older, your body mass index should be between 23-30. Your Body mass index is 25.4 kg/m. If this is out of the aforementioned range listed, please consider follow up with your Primary Care Provider.  If you are age 39 or younger, your body mass index should be between 19-25. Your Body mass index is 25.4 kg/m. If this is out of the aformentioned range listed, please consider follow up with your Primary Care Provider.   We have sent the following medications to your pharmacy for you to pick up at your convenience: Nitroglycerin ointment  Take Stool Softners daily.  Call for an appointment in one month with Amy Esterwood, PA-C if symptoms haven't significantly improved.  Thank you for choosing me and [facility].  Amy Esterwood, PA-C

## 2016-09-16 ENCOUNTER — Telehealth: Payer: Self-pay | Admitting: General Practice

## 2016-09-16 NOTE — Telephone Encounter (Signed)
[facility] pharmacy is checking on status of her request for the sertratine   Best number for pharmacy is [phone]

## 2016-09-17 MED ORDER — SERTRALINE HCL 100 MG PO TABS: 100.0000 mg | ORAL_TABLET | Freq: Every day | ORAL | 0 refills | Status: DC

## 2016-10-09 NOTE — Addendum Note (Signed)
Addendum  created 10/09/16 0817 by Kieana Livesay, MD   Sign clinical note    

## 2016-10-12 ENCOUNTER — Ambulatory Visit: Payer: BC Managed Care – PPO | Admitting: Cardiology

## 2016-10-17 ENCOUNTER — Other Ambulatory Visit: Payer: Self-pay | Admitting: Physician Assistant

## 2016-10-18 ENCOUNTER — Other Ambulatory Visit: Payer: Self-pay | Admitting: Urgent Care

## 2016-10-21 NOTE — Telephone Encounter (Signed)
Surescripts request for Sertraline refill. Has not seen you in over 6 months  Please advise.

## 2016-10-21 NOTE — Telephone Encounter (Signed)
mychart message sent to patient about making a F/U

## 2016-10-21 NOTE — Telephone Encounter (Signed)
Patient needs an OV for additional refills. Please schedule appointment.

## 2016-10-25 ENCOUNTER — Ambulatory Visit: Payer: BC Managed Care – PPO | Admitting: Cardiovascular Disease

## 2016-11-23 ENCOUNTER — Ambulatory Visit: Payer: BC Managed Care – PPO | Admitting: Cardiology

## 2016-12-30 NOTE — Addendum Note (Signed)
Addendum  created 12/30/16 1311 by Kipp Brood, MD   Sign clinical note

## 2017-02-28 ENCOUNTER — Ambulatory Visit: Payer: BC Managed Care – PPO | Admitting: Cardiology

## 2018-08-30 ENCOUNTER — Other Ambulatory Visit: Payer: Self-pay | Admitting: Obstetrics and Gynecology

## 2018-08-30 DIAGNOSIS — R928 Other abnormal and inconclusive findings on diagnostic imaging of breast: Secondary | ICD-10-CM

## 2018-09-18 ENCOUNTER — Ambulatory Visit
Admission: RE | Admit: 2018-09-18 | Discharge: 2018-09-18 | Disposition: A | Discharge status: Home / Self Care | Payer: BC Managed Care – PPO | Source: Ambulatory Visit | Attending: Obstetrics and Gynecology | Admitting: Obstetrics and Gynecology

## 2018-09-18 ENCOUNTER — Other Ambulatory Visit: Payer: Self-pay

## 2018-09-18 ENCOUNTER — Other Ambulatory Visit: Payer: Self-pay | Admitting: Obstetrics and Gynecology

## 2018-09-18 DIAGNOSIS — R928 Other abnormal and inconclusive findings on diagnostic imaging of breast: Secondary | ICD-10-CM

## 2018-09-18 DIAGNOSIS — N6489 Other specified disorders of breast: Secondary | ICD-10-CM

## 2019-03-06 ENCOUNTER — Encounter: Payer: Self-pay | Admitting: Emergency Medicine

## 2019-03-06 ENCOUNTER — Other Ambulatory Visit: Payer: Self-pay

## 2019-03-06 ENCOUNTER — Ambulatory Visit: Payer: BC Managed Care – PPO | Admitting: Emergency Medicine

## 2019-03-06 VITALS — BP 111/66 | HR 86 | Temp 99.2°F | Resp 16 | Ht 70.0 in | Wt 187.2 lb

## 2019-03-06 DIAGNOSIS — M2550 Pain in unspecified joint: Secondary | ICD-10-CM

## 2019-03-06 DIAGNOSIS — Z87898 Personal history of other specified conditions: Secondary | ICD-10-CM

## 2019-03-06 DIAGNOSIS — Z8739 Personal history of other diseases of the musculoskeletal system and connective tissue: Secondary | ICD-10-CM | POA: Diagnosis not present

## 2019-03-06 DIAGNOSIS — Z7689 Persons encountering health services in other specified circumstances: Secondary | ICD-10-CM | POA: Diagnosis not present

## 2019-03-06 DIAGNOSIS — R5382 Chronic fatigue, unspecified: Secondary | ICD-10-CM

## 2019-03-06 DIAGNOSIS — G8929 Other chronic pain: Secondary | ICD-10-CM

## 2019-03-06 NOTE — Patient Instructions (Addendum)
   If you have lab work done today you will be contacted with your lab results within the next 2 weeks.  If you have not heard from us then please contact us. The fastest way to get your results is to register for My Chart.   IF you received an x-ray today, you will receive an invoice from [facility]. Please contact [facility] at [phone] with questions or concerns regarding your invoice.   IF you received labwork today, you will receive an invoice from [facility]. Please contact [facility] at [phone] with questions or concerns regarding your invoice.   Our billing staff will not be able to assist you with questions regarding bills from these companies.  You will be contacted with the lab results as soon as they are available. The fastest way to get your results is to activate your My Chart account. Instructions are located on the last page of this paperwork. If you have not heard from us regarding the results in 2 weeks, please contact this office.     Joint Pain  Joint pain can be caused by many things. It is likely to go away if you follow instructions from your doctor for taking care of yourself at home. Sometimes, you may need more treatment. Follow these instructions at home: Managing pain, stiffness, and swelling   If told, put ice on the painful area. ? Put ice in a plastic bag. ? Place a towel between your skin and the bag. ? Leave the ice on for 20 minutes, 2-3 times a day.  If told, put heat on the painful area. Do this as often as told by your doctor. Use the heat source that your doctor recommends, such as a moist heat pack or a heating pad. ? Place a towel between your skin and the heat source. ? Leave the heat on for 20-30 minutes. ? Take off the heat if your skin gets bright red. This is especially important if you are unable to feel pain, heat, or cold. You may have a greater risk of getting burned.  Move your fingers or toes below the  painful joint often. This helps with stiffness and swelling.  If possible, raise (elevate) the painful joint above the level of your heart while you are sitting or lying down. To do this, try putting a few pillows under the painful joint. Activity  Rest the painful joint for as long as told. Do not do things that cause pain or make your pain worse.  Begin exercising or stretching the affected area, as told by your doctor. Ask your doctor what types of exercise are safe for you. If you have an elastic bandage, sling, or splint:  Wear the device as told by your doctor. Take it off only as told by your doctor.  Loosen the device if your fingers or toes below the joint: ? Tingle. ? Lose feeling (get numb). ? Get cold and blue.  Keep the device clean.  Ask your doctor if you should take off the device before bathing. You may need to cover it with a watertight covering when you take a bath or a shower. General instructions  Take over-the-counter and prescription medicines only as told by your doctor.  Do not use any products that contain nicotine or tobacco. These include cigarettes and e-cigarettes. If you need help quitting, ask your doctor.  Keep all follow-up visits as told by your doctor. This is important. Contact a doctor if:  You have   pain that gets worse and does not get better with medicine.  Your joint pain does not get better in 3 days.  You have more bruising or swelling.  You have a fever.  You lose 10 lb (4.5 kg) or more without trying. Get help right away if:  You cannot move the joint.  Your fingers or toes tingle, lose feeling, or get cold and blue.  You have a fever along with a joint that is red, warm, and swollen. Summary  Joint pain can be caused by many things. It often goes away if you follow instructions from your doctor for taking care of yourself at home.  Rest the painful joint for as long as told. Do not do things that cause pain or make your  pain worse.  Take over-the-counter and prescription medicines only as told by your doctor. This information is not intended to replace advice given to you by your health care provider. Make sure you discuss any questions you have with your health care provider. Document Released: 04/14/2009 Document Revised: 04/08/2017 Document Reviewed: 02/09/2017 [employer] Patient Education  2020 [employer]  

## 2019-03-06 NOTE — Progress Notes (Addendum)
Autumn Dixon 43 y.o.   No chief complaint on file.   HISTORY OF PRESENT ILLNESS: This is a 43 y.o. female complaining of migrating pain to multiple joints for several years. Here to establish care with me, first visit. In the past patient was diagnosed with fibromyalgia several years ago.  Was tried on Lyrica without success.  Presently only taking ibuprofen as needed.  Pain has been increasing slowly and progressively over the past 3 to 5 years.  Describes pain as sharp in multiple different joints with no predictable pattern.  Pain worse in her hips.  Joint stiffness.  Feels tired and exhausted most of the time.  Cannot sleep and has a history of chronic insomnia.  Past medical history includes pericarditis and cholelithiasis status post cholecystectomy. States that mentally she feels okay but physically she is not. Has seen Dr. Corliss Skains (rheumatologist) in the past and is requesting a referral to see her again. No other complaints or medical concerns at this time. Medical records available reviewed during this visit. HPI   Prior to Admission medications   Medication Sig Start Date End Date Taking? Authorizing Provider  alprazolam Prudy Feeler) 2 MG tablet Take 2 mg by mouth at bedtime as needed for sleep or anxiety.    [provider]  ibuprofen (ADVIL,MOTRIN) 200 MG tablet Take 800 mg by mouth every 6 (six) hours as needed for headache or moderate pain.    [provider]  metoprolol tartrate (LOPRESSOR) 25 MG tablet Take 0.5 tablets (12.5 mg total) by mouth 2 (two) times daily. 07/05/16   Janetta Hora, PA-C  nitroGLYCERIN (NITROGLYN) 2 % OINT ointment Apply 1 application topically 3 (three) times daily. 09/13/16   Esterwood, Amy S, PA-C  sertraline (ZOLOFT) 100 MG tablet Take 1 tablet (100 mg total) by mouth daily. Office visit needed for additional refills. 10/21/16   Wallis Bamberg, PA-C    No Known Allergies  Patient Active Problem List   Diagnosis Date Noted  .  Abdominal pain, chronic, epigastric   . Cholelithiasis with choledocholithiasis 08/22/2016  . Fibromyalgia   . Family history of premature CAD 07/04/2016    Past Medical History:  Diagnosis Date  . Anxiety   . Cholecystitis   . Depression   . Family history of premature CAD 07/04/2016  . Fibromyalgia     Past Surgical History:  Procedure Laterality Date  . CHOLECYSTECTOMY N/A 08/24/2016   Procedure: LAPAROSCOPIC CHOLECYSTECTOMY;  Surgeon: Violeta Gelinas, MD;  Location: [facility] OR;  Service: General;  Laterality: N/A;  . ERCP N/A 08/23/2016   Procedure: ENDOSCOPIC RETROGRADE CHOLANGIOPANCREATOGRAPHY (ERCP);  Surgeon: Sherrilyn Rist, MD;  Location[facility] ENDOSCOPY;  Service: Gastroenterology;  Laterality: N/A;  . GANGLION CYST EXCISION Right    wrist  . KNEE ARTHROSCOPY Right   . SHOULDER ARTHROSCOPY Right     Social History   Socioeconomic History  . Marital status: Married    Spouse name: Not on file  . Number of children: 4  . Years of education: Not on file  . Highest education level: Not on file  Occupational History  . Occupation: Air traffic controller  Social Needs  . Financial resource strain: Not on file  . Food insecurity    Worry: Not on file    Inability: Not on file  . Transportation needs    Medical: Not on file    Non-medical: Not on file  Tobacco Use  . Smoking status: Former Smoker    Years: 1.00  Types: Cigarettes    Quit date: 05/10/1997    Years since quitting: 21.8  . Smokeless tobacco: Never Used  Substance and Sexual Activity  . Alcohol use: Yes    Alcohol/week: 1.0 standard drinks    Types: 1 Glasses of wine per week    Comment: occasional  . Drug use: No  . Sexual activity: Not on file  Lifestyle  . Physical activity    Days per week: Not on file    Minutes per session: Not on file  . Stress: Not on file  Relationships  . Social Musician on phone: Not on file    Gets together: Not on file    Attends religious service:  Not on file    Active member of club or organization: Not on file    Attends meetings of clubs or organizations: Not on file    Relationship status: Not on file  . Intimate partner violence    Fear of current or ex partner: Not on file    Emotionally abused: Not on file    Physically abused: Not on file    Forced sexual activity: Not on file  Other Topics Concern  . Not on file  Social History Narrative  . Not on file    Family History  Problem Relation Age of Onset  . Colon polyps Mother   . Ovarian cysts Mother   . Heart disease Father 28  . Diabetes Maternal Grandmother   . Colon polyps Maternal Grandmother   . Colitis Maternal Grandmother        ?  Marland Kitchen Irritable bowel syndrome Maternal Grandmother   . Pancreatic cancer Maternal Grandfather   . Diabetes Maternal Grandfather   . Colon cancer Paternal Grandfather   . Heart disease Paternal Grandfather   . Diabetes Brother   . Ovarian cysts Sister   . Heart attack Paternal Aunt        died 59s with mi  . Heart disease Paternal Aunt   . Heart attack Paternal Uncle        died in early 42 s MI      Review of Systems  Constitutional: Positive for malaise/fatigue. Negative for chills and fever.  HENT: Negative.  Negative for congestion and sore throat.   Eyes: Negative.  Negative for blurred vision and double vision.  Respiratory: Negative.  Negative for cough and shortness of breath.   Cardiovascular: Negative.  Negative for chest pain and palpitations.  Gastrointestinal: Negative for abdominal pain, blood in stool, diarrhea, melena, nausea and vomiting.  Genitourinary: Negative.  Negative for dysuria and hematuria.  Musculoskeletal: Positive for joint pain. Negative for myalgias and neck pain.  Skin: Negative.  Negative for rash.  Neurological: Negative.  Negative for dizziness, sensory change, speech change, focal weakness, seizures, loss of consciousness and headaches.  Psychiatric/Behavioral: The patient has insomnia.    All other systems reviewed and are negative.   Vitals:   03/06/19 1335  BP: 111/66  Pulse: 86  Resp: 16  Temp: 99.2 F (37.3 C)  SpO2: 100%    Physical Exam Vitals signs reviewed.  Constitutional:      Appearance: Normal appearance.  HENT:     Head: Normocephalic.     Mouth/Throat:     Mouth: Mucous membranes are moist.     Pharynx: Oropharynx is clear. No oropharyngeal exudate or posterior oropharyngeal erythema.  Eyes:     Extraocular Movements: Extraocular movements intact.     Pupils: Pupils  are equal, round, and reactive to light.  Neck:     Musculoskeletal: Normal range of motion and neck supple. No muscular tenderness.  Cardiovascular:     Rate and Rhythm: Normal rate and regular rhythm. Occasional extrasystoles are present.    Pulses: Normal pulses.     Heart sounds: Normal heart sounds.  Pulmonary:     Effort: Pulmonary effort is normal.     Breath sounds: Normal breath sounds.  Musculoskeletal: Normal range of motion.  Lymphadenopathy:     Cervical: No cervical adenopathy.  Skin:    General: Skin is warm and dry.     Capillary Refill: Capillary refill takes less than 2 seconds.  Neurological:     General: No focal deficit present.     Mental Status: She is alert and oriented to person, place, and time.  Psychiatric:        Mood and Affect: Mood normal.        Behavior: Behavior normal.     A total of 40 minutes was spent in the room with the patient, greater than 50% of which was in counseling/coordination of care regarding differential diagnosis, need for diagnostic work-up including blood tests, discussion of different treatments for chronic pain, lifestyle choices, and need for rheumatology evaluation.  ASSESSMENT & PLAN: Autumn DandyMary was seen today for establish care and referral.  Diagnoses and all orders for this visit:  Arthralgia of multiple joints -     CBC with Differential/Platelet -     Comprehensive metabolic panel -     Hemoglobin A1c -      Vitamin B12 -     ANA,IFA RA Diag Pnl w/rflx Tit/Patn -     Lyme Ab/Western Blot Reflex -     Rickettsial Fever Group IgG/M -     Ambulatory referral to Rheumatology  Encounter to establish care  History of fibromyalgia  Other chronic pain  History of insomnia  Chronic fatigue     Patient Instructions       If you have lab work done today you will be contacted with your lab results within the next 2 weeks.  If you have not heard from us then please contact us. The fastest way to get your results is to register for My Chart.   IF you received an x-ray today, you will receive an invoice from [facility]. Please contact [facility] at [phone] with questions or concerns regarding your invoice.   IF you received labwork today, you will receive an invoice from [facility]. Please contact [facility] at [phone] with questions or concerns regarding your invoice.   Our billing staff will not be able to assist you with questions regarding bills from these companies.  You will be contacted with the lab results as soon as they are available. The fastest way to get your results is to activate your My Chart account. Instructions are located on the last page of this paperwork. If you have not heard from us regarding the results in 2 weeks, please contact this office.     Joint Pain  Joint pain can be caused by many things. It is likely to go away if you follow instructions from your doctor for taking care of yourself at home. Sometimes, you may need more treatment. Follow these instructions at home: Managing pain, stiffness, and swelling   If told, put ice on the painful area. ? Put ice in a plastic bag. ? Place a towel between your skin and the bag. ?  Leave the ice on for 20 minutes, 2-3 times a day.  If told, put heat on the painful area. Do this as often as told by your doctor. Use the heat source that your doctor recommends, such as a moist heat pack or  a heating pad. ? Place a towel between your skin and the heat source. ? Leave the heat on for 20-30 minutes. ? Take off the heat if your skin gets bright red. This is especially important if you are unable to feel pain, heat, or cold. You may have a greater risk of getting burned.  Move your fingers or toes below the painful joint often. This helps with stiffness and swelling.  If possible, raise (elevate) the painful joint above the level of your heart while you are sitting or lying down. To do this, try putting a few pillows under the painful joint. Activity  Rest the painful joint for as long as told. Do not do things that cause pain or make your pain worse.  Begin exercising or stretching the affected area, as told by your doctor. Ask your doctor what types of exercise are safe for you. If you have an elastic bandage, sling, or splint:  Wear the device as told by your doctor. Take it off only as told by your doctor.  Loosen the device if your fingers or toes below the joint: ? Tingle. ? Lose feeling (get numb). ? Get cold and blue.  Keep the device clean.  Ask your doctor if you should take off the device before bathing. You may need to cover it with a watertight covering when you take a bath or a shower. General instructions  Take over-the-counter and prescription medicines only as told by your doctor.  Do not use any products that contain nicotine or tobacco. These include cigarettes and e-cigarettes. If you need help quitting, ask your doctor.  Keep all follow-up visits as told by your doctor. This is important. Contact a doctor if:  You have pain that gets worse and does not get better with medicine.  Your joint pain does not get better in 3 days.  You have more bruising or swelling.  You have a fever.  You lose 10 lb (4.5 kg) or more without trying. Get help right away if:  You cannot move the joint.  Your fingers or toes tingle, lose feeling, or get cold and  blue.  You have a fever along with a joint that is red, warm, and swollen. Summary  Joint pain can be caused by many things. It often goes away if you follow instructions from your doctor for taking care of yourself at home.  Rest the painful joint for as long as told. Do not do things that cause pain or make your pain worse.  Take over-the-counter and prescription medicines only as told by your doctor. This information is not intended to replace advice given to you by your health care provider. Make sure you discuss any questions you have with your health care provider. Document Released: 04/14/2009 Document Revised: 04/08/2017 Document Reviewed: 02/09/2017 Elsevier Patient Education  2020 Elsevier Inc.      Agustina Caroli, MD [facility]

## 2019-03-10 ENCOUNTER — Encounter: Payer: Self-pay | Admitting: Emergency Medicine

## 2019-03-10 LAB — COMPREHENSIVE METABOLIC PANEL
ALT: 10 IU/L (ref 0–32)
AST: 11 IU/L (ref 0–40)
Albumin/Globulin Ratio: 1.9 (ref 1.2–2.2)
Albumin: 4.1 g/dL (ref 3.8–4.8)
Alkaline Phosphatase: 70 IU/L (ref 39–117)
BUN/Creatinine Ratio: 10 (ref 9–23)
BUN: 7 mg/dL (ref 6–24)
Bilirubin Total: 0.4 mg/dL (ref 0.0–1.2)
CO2: 24 mmol/L (ref 20–29)
Calcium: 9 mg/dL (ref 8.7–10.2)
Chloride: 107 mmol/L — ABNORMAL HIGH (ref 96–106)
Creatinine, Ser: 0.73 mg/dL (ref 0.57–1.00)
GFR calc Af Amer: 118 mL/min/{1.73_m2} (ref >59)
GFR calc non Af Amer: 103 mL/min/{1.73_m2} (ref >59)
Globulin, Total: 2.2 g/dL (ref 1.5–4.5)
Glucose: 86 mg/dL (ref 65–99)
Potassium: 4.1 mmol/L (ref 3.5–5.2)
Sodium: 142 mmol/L (ref 134–144)
Total Protein: 6.3 g/dL (ref 6.0–8.5)

## 2019-03-10 LAB — CBC WITH DIFFERENTIAL/PLATELET
Basophils Absolute: 0 10*3/uL (ref 0.0–0.2)
Basos: 0 %
EOS (ABSOLUTE): 0.1 10*3/uL (ref 0.0–0.4)
Eos: 1 %
Hematocrit: 37.1 % (ref 34.0–46.6)
Hemoglobin: 12.6 g/dL (ref 11.1–15.9)
Immature Grans (Abs): 0.1 10*3/uL (ref 0.0–0.1)
Immature Granulocytes: 1 %
Lymphocytes Absolute: 1.9 10*3/uL (ref 0.7–3.1)
Lymphs: 25 %
MCH: 31.1 pg (ref 26.6–33.0)
MCHC: 34 g/dL (ref 31.5–35.7)
MCV: 92 fL (ref 79–97)
Monocytes Absolute: 0.5 10*3/uL (ref 0.1–0.9)
Monocytes: 6 %
Neutrophils Absolute: 5.1 10*3/uL (ref 1.4–7.0)
Neutrophils: 67 %
Platelets: 361 10*3/uL (ref 150–450)
RBC: 4.05 x10E6/uL (ref 3.77–5.28)
RDW: 11.7 % (ref 11.7–15.4)
WBC: 7.6 10*3/uL (ref 3.4–10.8)

## 2019-03-10 LAB — ANA,IFA RA DIAG PNL W/RFLX TIT/PATN
ANA Titer 1: NEGATIVE
Cyclic Citrullin Peptide Ab: 6 units (ref 0–19)
Rheumatoid fact SerPl-aCnc: <10 IU/mL (ref 0.0–13.9)

## 2019-03-10 LAB — HEMOGLOBIN A1C
Est. average glucose Bld gHb Est-mCnc: 105 mg/dL
Hgb A1c MFr Bld: 5.3 % (ref 4.8–5.6)

## 2019-03-10 LAB — RICKETTSIAL FEVER GROUP IGG/M
Spotted Fever Group IgG: <1:64 {titer}
Spotted Fever Group IgM: <1:64 {titer}
Typhus Fever Group IgG: <1:64 {titer}
Typhus Fever Group IgM: <1:64 {titer}

## 2019-03-10 LAB — LYME AB/WESTERN BLOT REFLEX
LYME DISEASE AB, QUANT, IGM: <0.8 index (ref 0.00–0.79)
Lyme IgG/IgM Ab: <0.91 {ISR} (ref 0.00–0.90)

## 2019-03-10 LAB — VITAMIN B12: Vitamin B-12: 333 pg/mL (ref 232–1245)

## 2019-03-22 ENCOUNTER — Ambulatory Visit
Admission: RE | Admit: 2019-03-22 | Discharge: 2019-03-22 | Disposition: A | Discharge status: Home / Self Care | Payer: BC Managed Care – PPO | Source: Ambulatory Visit | Attending: Obstetrics and Gynecology | Admitting: Obstetrics and Gynecology

## 2019-03-22 ENCOUNTER — Other Ambulatory Visit: Payer: Self-pay

## 2019-03-22 ENCOUNTER — Other Ambulatory Visit: Payer: Self-pay | Admitting: Obstetrics and Gynecology

## 2019-03-22 DIAGNOSIS — N6489 Other specified disorders of breast: Secondary | ICD-10-CM

## 2019-03-28 NOTE — Progress Notes (Addendum)
Virtual Visit via Video Note  I connected with Autumn Dixon on 04/11/19 at  8:15 AM EST by a video enabled telemedicine application and verified that I am speaking with the correct person using two identifiers.  Location: Patient: Home  Provider: Clinic  This service was conducted via virtual visit.  Both audio and visual tools were used.  The patient was located at home. I was located in my office.  Consent was obtained prior to the virtual visit and is aware of possible charges through their insurance for this visit.  The patient is an established patient.  Dr. Corliss Skains, MD conducted the virtual visit and Sherron Ales, PA-C acted as scribe during the service.  Office staff helped with scheduling follow up visits after the service was conducted.   I discussed the limitations of evaluation and management by telemedicine and the availability of in person appointments. The patient expressed understanding and agreed to proceed.  CC: History of Present Illness: Patient is a 43 year old female with a past medical history of fibromyalgia syndrome.  According to patient she saw me 12 years ago when she was experiencing joint pain and muscle pain.  According to her at the time I diagnosed her with fibromyalgia syndrome.  She states she took Lyrica for about 2 years but did not notice much help and discontinued the medication.  She states she continued to have episodes of joint pain and muscle pain which were severe and will happen about twice a year.  In the last 5 years the episodes have become more frequent.  She states she is very active and deals with her symptoms quite often.  She states she has had history of oral ulcers which are very painful since she was 43 years old and they continue to occur.  She has noticed the oral ulcers are more frequent when she was having these episodes.  She has never seen a dermatologist.  She has been given topical numbing creams per patient.  She sent [place] some pictures  of the sores in her mouth.  She denies any history of nasal ulcers or genital ulcers.  She states the pain is mostly in her joints and extremities.  She has experienced pain in her shoulders, elbows, wrists, hips, knees and her ankles.  She has not seen any visible swelling.  She states one time when she was driving she could not drive because her left wrist was very painful.  She also notices spasms in her shins and her calves.  She states the pain could be so severe that she has difficulty getting up from the sitting position and she cries when she has these episodes.  Her last episode which was most severe was on September 14 when she was very stiff and went to work but had difficulty doing all the activities but next day she woke up completely pain-free.  She had no symptoms in November for about 3 weeks.  Now the symptoms are coming back.  She is also had discomfort in her TMJ joint.  She states she has never slept well  since she was a teenager.  She has considered getting a sleep study but never did get one.  There is no family history of autoimmune disease.  She states she has had labs several times in the past and they have been all negative.  There is no history of oral ulcers, nasal ulcers, malar rash, photosensitivity, Raynaud's phenomenon, lymphadenopathy.  Review of Systems  Constitutional:  Positive for malaise/fatigue. Negative for fever.  HENT:       Denies nasal ulcers +Oral ulcers Denies mouth dryness  Eyes: Negative for photophobia, pain, discharge and redness.       Denies eye dryness  Respiratory: Negative for cough, shortness of breath and wheezing.   Cardiovascular: Negative for chest pain and palpitations.  Gastrointestinal: Negative for blood in stool, constipation and diarrhea.  Genitourinary: Negative for dysuria.  Musculoskeletal: Positive for joint pain and myalgias. Negative for back pain and neck pain.  Skin: Negative for rash.       Denies any photosensitivity  Denies  symptoms of Raynaud's  Neurological: Negative for dizziness and headaches.  Psychiatric/Behavioral: Negative for depression. The patient has insomnia. The patient is not nervous/anxious.       Observations/Objective: Physical Exam  Constitutional: She is oriented to person, place, and time and well-developed, well-nourished, and in no distress.  HENT:  Head: Normocephalic and atraumatic.  Eyes: Conjunctivae are normal.  Pulmonary/Chest: Effort normal.  Neurological: She is alert and oriented to person, place, and time.  Psychiatric: Mood, memory, affect and judgment normal.    Patient reports morning stiffness for 0 minute.   Patient denies nocturnal pain.  Difficulty dressing/grooming: Denies Difficulty climbing stairs: Denies Difficulty getting out of chair: Denies Difficulty using hands for taps, buttons, cutlery, and/or writing: Denies  Assessment and Plan: Polyarthralgia patient gives history of episodic joint pain for at least 12 years.  Symptoms are becoming more and more frequent over the years.  The pain has been severe to the point she is unable to function.  And certainly the pain goes away after a few days.  She also experiences muscle pain.  All her autoimmune work-up in the past has been negative for patient.  She had recent labs done by her PCP and her ANA, anti-CCP antibody were negative.  I will obtain additional blood work.  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  Insomnia-she gives history of insomnia since she was a teenager.  That could be contributing to her symptoms and especially fibromyalgia syndrome.  Patient has tried Ambien in the past and did not notice any benefit from it.  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  She is sent pictures of the oral ulcers through my chart.  I have advised her to schedule an appointment with dermatology for possible biopsy of the lesion.  Fatigue-she has had chronic fatigue which  could be due to chronic insomnia and generalized discomfort.  Fibromyalgia-she has history of generalized pain and discomfort.  She also gives history of hyperalgesia.  She has tried Lyrica in the past and did not notice any benefit from it.  Cholelithiasis with choledocholithiasis   Family history of premature CAD     Follow Up Instructions: She will follow up in    I discussed the assessment and treatment plan with the patient. The patient was provided an opportunity to ask questions and all were answered. The patient agreed with the plan and demonstrated an understanding of the instructions.   The patient was advised to call back or seek an in-person evaluation if the symptoms worsen or if the condition fails to improve as anticipated.  I provided 60 minutes of non-face-to-face time during this encounter.   Bo Merino, MD

## 2019-04-10 ENCOUNTER — Encounter: Payer: Self-pay | Admitting: Rheumatology

## 2019-04-11 ENCOUNTER — Encounter: Payer: Self-pay | Admitting: Rheumatology

## 2019-04-11 ENCOUNTER — Telehealth (INDEPENDENT_AMBULATORY_CARE_PROVIDER_SITE_OTHER): Payer: BC Managed Care – PPO | Admitting: Rheumatology

## 2019-04-11 ENCOUNTER — Other Ambulatory Visit: Payer: Self-pay

## 2019-04-11 DIAGNOSIS — K8070 Calculus of gallbladder and bile duct without cholecystitis without obstruction: Secondary | ICD-10-CM

## 2019-04-11 DIAGNOSIS — Z8249 Family history of ischemic heart disease and other diseases of the circulatory system: Secondary | ICD-10-CM | POA: Diagnosis not present

## 2019-04-11 DIAGNOSIS — M2550 Pain in unspecified joint: Secondary | ICD-10-CM

## 2019-04-11 DIAGNOSIS — R5383 Other fatigue: Secondary | ICD-10-CM

## 2019-04-11 DIAGNOSIS — M797 Fibromyalgia: Secondary | ICD-10-CM

## 2019-04-11 DIAGNOSIS — K1379 Other lesions of oral mucosa: Secondary | ICD-10-CM

## 2019-04-11 DIAGNOSIS — F5101 Primary insomnia: Secondary | ICD-10-CM

## 2019-04-26 ENCOUNTER — Encounter: Payer: Self-pay | Admitting: Rheumatology

## 2019-04-27 ENCOUNTER — Telehealth: Payer: Self-pay | Admitting: Rheumatology

## 2019-04-27 NOTE — Telephone Encounter (Signed)
-----   Message from Shona Needles, RT sent at 04/11/2019  4:23 PM EST ----- Regarding: NP F/U APPT AFTER LAB WORK

## 2019-04-27 NOTE — Telephone Encounter (Signed)
I LMOM for patient to call, and reschedule follow up to 05/21/2019 for an in person appointment.

## 2019-04-30 ENCOUNTER — Other Ambulatory Visit: Payer: Self-pay

## 2019-04-30 DIAGNOSIS — E559 Vitamin D deficiency, unspecified: Secondary | ICD-10-CM

## 2019-04-30 MED ORDER — VITAMIN D (ERGOCALCIFEROL) 1.25 MG (50000 UNIT) PO CAPS: 50000.0000 [IU] | ORAL_CAPSULE | ORAL | 0 refills | Status: AC

## 2019-05-01 ENCOUNTER — Ambulatory Visit: Payer: BC Managed Care – PPO | Admitting: Rheumatology

## 2019-05-01 NOTE — Progress Notes (Signed)
I will discuss results at the follow-up visit.

## 2019-05-02 LAB — IRON,TIBC AND FERRITIN PANEL
%SAT: 54 % (calc) — ABNORMAL HIGH (ref 16–45)
Ferritin: 46 ng/mL (ref 16–232)
Iron: 147 ug/dL (ref 40–190)
TIBC: 273 mcg/dL (calc) (ref 250–450)

## 2019-05-02 LAB — 14-3-3 ETA PROTEIN: 14-3-3 eta Protein: <0.2 ng/mL (ref <0.2)

## 2019-05-02 LAB — ANGIOTENSIN CONVERTING ENZYME: Angiotensin-Converting Enzyme: 36 U/L (ref 9–67)

## 2019-05-02 LAB — CK: Total CK: 64 U/L (ref 29–143)

## 2019-05-02 LAB — SEDIMENTATION RATE: Sed Rate: 2 mm/h (ref 0–20)

## 2019-05-02 LAB — RHEUMATOID FACTOR: Rheumatoid fact SerPl-aCnc: <14 IU/mL (ref <14)

## 2019-05-02 LAB — GLIADIN ANTIBODIES, SERUM
Gliadin IgA: 3 Units
Gliadin IgG: 1 Units

## 2019-05-02 LAB — PAN-ANCA
ANCA Screen: NEGATIVE
Myeloperoxidase Abs: <1 AI
Serine Protease 3: <1 AI

## 2019-05-02 LAB — VITAMIN D 25 HYDROXY (VIT D DEFICIENCY, FRACTURES): Vit D, 25-Hydroxy: 26 ng/mL — ABNORMAL LOW (ref 30–100)

## 2019-05-02 LAB — ANTI-DNA ANTIBODY, DOUBLE-STRANDED: ds DNA Ab: <1 IU/mL

## 2019-05-02 LAB — ANTI-SMITH ANTIBODY: ENA SM Ab Ser-aCnc: <1 AI

## 2019-05-02 LAB — TSH: TSH: 2.29 mIU/L

## 2019-05-02 LAB — URIC ACID: Uric Acid, Serum: 3.7 mg/dL (ref 2.5–7.0)

## 2019-05-02 LAB — HLA-B27 ANTIGEN: HLA-B27 Antigen: NEGATIVE

## 2019-05-02 LAB — TISSUE TRANSGLUTAMINASE, IGA: (tTG) Ab, IgA: 1 U/mL

## 2019-05-09 NOTE — Progress Notes (Deleted)
Office Visit Note  Patient: Autumn Dixon             Date of Birth: 12-Apr-1976           MRN: [id number]             PCP: Horald Pollen, MD Referring: Horald Pollen, * Visit Date: 05/15/2019 Occupation: _0 @  Subjective:  No chief complaint on file.   History of Present Illness: Autumn Dixon is a 43 y.o. female ***   Activities of Daily Living:  Patient reports morning stiffness for *** {minute/hour:19697}.   Patient {ACTIONS;DENIES/REPORTS:21021675::"Denies"} nocturnal pain.  Difficulty dressing/grooming: {ACTIONS;DENIES/REPORTS:21021675::"Denies"} Difficulty climbing stairs: {ACTIONS;DENIES/REPORTS:21021675::"Denies"} Difficulty getting out of chair: {ACTIONS;DENIES/REPORTS:21021675::"Denies"} Difficulty using hands for taps, buttons, cutlery, and/or writing: {ACTIONS;DENIES/REPORTS:21021675::"Denies"}  No Rheumatology ROS completed.   PMFS History:  Patient Active Problem List   Diagnosis Date Noted  . Abdominal pain, chronic, epigastric   . Cholelithiasis with choledocholithiasis 08/22/2016  . Fibromyalgia   . Family history of premature CAD 07/04/2016    Past Medical History:  Diagnosis Date  . Anxiety   . Cholecystitis   . Depression   . Family history of premature CAD 07/04/2016  . Fibromyalgia   . Mouth ulcers     Family History  Problem Relation Age of Onset  . Colon polyps Mother   . Ovarian cysts Mother   . Heart disease Father 20  . High Cholesterol Father   . Hypertension Father   . Diabetes Maternal Grandmother   . Colon polyps Maternal Grandmother   . Colitis Maternal Grandmother        ?  Marland Kitchen Irritable bowel syndrome Maternal Grandmother   . High Cholesterol Maternal Grandmother   . Pancreatic cancer Maternal Grandfather   . Diabetes Maternal Grandfather   . Heart disease Paternal Grandmother   . High Cholesterol Paternal Grandmother   . Hypertension Paternal Grandmother   . Colon cancer Paternal Grandfather   . Heart  disease Paternal Grandfather   . Diabetes Brother   . Ovarian cysts Sister   . Heart attack Paternal Aunt        died 60s with mi  . Heart disease Paternal Aunt   . Heart attack Paternal Uncle        died in early 35 s MI    Past Surgical History:  Procedure Laterality Date  . CHOLECYSTECTOMY N/A 08/24/2016   Procedure: LAPAROSCOPIC CHOLECYSTECTOMY;  Surgeon: Georganna Skeans, MD;  Location: [facility];  Service: General;  Laterality: N/A;  . ERCP N/A 08/23/2016   Procedure: ENDOSCOPIC RETROGRADE CHOLANGIOPANCREATOGRAPHY (ERCP);  Surgeon: Doran Stabler, MD;  Location: [facility];  Service: Gastroenterology;  Laterality: N/A;  . GANGLION CYST EXCISION Right    wrist  . KNEE ARTHROSCOPY Right   . SHOULDER ARTHROSCOPY Right    Social History   Social History Narrative  . Not on file   Immunization History  Administered Date(s) Administered  . DTaP 03/23/1978, 04/22/1978, 05/23/1978, 08/03/1979, 10/16/1982  . IPV 03/23/1978, 05/23/1978, 07/21/1978, 08/03/1979  . Influenza Split 02/22/2013  . MMR 12/04/2015  . Measles 04/24/1979  . Mumps 04/24/1979  . Rubella 04/24/1979  . Tdap 12/04/2015     Objective: Vital Signs: There were no vitals taken for this visit.   Physical Exam   Musculoskeletal Exam: ***  CDAI Exam: CDAI Score: -- Patient Global: --; Provider Global: -- Swollen: --; Tender: -- Joint Exam 05/15/2019   No joint exam has been documented for this visit  There is currently no information documented on the homunculus. Go to the Rheumatology activity and complete the homunculus joint exam.  Investigation: No additional findings.  Imaging: No results found.  Recent Labs: Lab Results  Component Value Date   WBC 7.6 03/06/2019   HGB 12.6 03/06/2019   PLT 361 03/06/2019   NA 142 03/06/2019   K 4.1 03/06/2019   CL 107 (H) 03/06/2019   CO2 24 03/06/2019   GLUCOSE 86 03/06/2019   BUN 7 03/06/2019   CREATININE 0.73 03/06/2019   BILITOT 0.4 03/06/2019    ALKPHOS 70 03/06/2019   AST 11 03/06/2019   ALT 10 03/06/2019   PROT 6.3 03/06/2019   ALBUMIN 4.1 03/06/2019   CALCIUM 9.0 03/06/2019   GFRAA 118 03/06/2019  April 27, 2019 iron percent saturation 54 (mildly elevated) CK 64, TSH normal, ESR 2, vitamin D 26, ANCA negative, RF negative, _0 eta negative, dsDNA negative, anti-Smith negative, anti-tTG negative antigliadin negative, uric acid 3.7, HLA-B27 negative, ACE 36  ANA and anti-CCP were done by her PCP which were negative.  Speciality Comments: No specialty comments available.  Procedures:  No procedures performed Allergies: Patient has no known allergies.   Assessment / Plan:     Visit Diagnoses: No diagnosis found.  Orders: No orders of the defined types were placed in this encounter.  No orders of the defined types were placed in this encounter.   Face-to-face time spent with patient was *** minutes. Greater than 50% of time was spent in counseling and coordination of care.  Follow-Up Instructions: No follow-ups on file.   Bo Merino, MD  Note - This record has been created using Editor, commissioning.  Chart creation errors have been sought, but may not always  have been located. Such creation errors do not reflect on  the standard of medical care.

## 2019-05-15 ENCOUNTER — Ambulatory Visit: Payer: BC Managed Care – PPO | Admitting: Rheumatology

## 2019-05-30 NOTE — Progress Notes (Signed)
Office Visit Note  Patient: Autumn Dixon             Date of Birth: Feb 20, 1976           MRN: [id number]             PCP: Horald Pollen, MD Referring: Horald Pollen, * Visit Date: 06/06/2019 Occupation: _0 @  Subjective:  Pain in multiple joints   History of Present Illness: Autumn Dixon is a 44 y.o. female with history of polyarthralgia and fibromyalgia. She reports on 05/01/19 through 05/08/19 she was experiencing increased arthralgias and myalgias.  She states the pain was migratory. She states her pain was severe and she had difficulty getting out of the bed.  She has chronic constant pain in both knee joints.  She has difficulty getting up from a seated position and climbing steps due to the discomfort in both knee joints.  She notices intermittent swelling in both knee joints. She has intermittent right SI joint pain and trochanteric bursitis.  She denies any achilles tendonitis or plantar fasciitis.  She continues to have chronic fatigue related to insomnia.  She has started taking vitamin D 50,000 units by mouth once weekly.  Activities of Daily Living:  Patient reports morning stiffness for 1 hour.   Patient Reports nocturnal pain.  Difficulty dressing/grooming: Reports Difficulty climbing stairs: Reports Difficulty getting out of chair: Reports Difficulty using hands for taps, buttons, cutlery, and/or writing: Reports  Review of Systems  Constitutional: Positive for fatigue.  HENT: Positive for mouth sores. Negative for mouth dryness and nose dryness.   Eyes: Negative for pain, visual disturbance and dryness.  Respiratory: Negative for cough, hemoptysis, shortness of breath and difficulty breathing.   Cardiovascular: Negative for chest pain, palpitations, hypertension and swelling in legs/feet.  Gastrointestinal: Negative for blood in stool, constipation and diarrhea.  Endocrine: Negative for increased urination.  Genitourinary: Negative for painful  urination.  Musculoskeletal: Positive for arthralgias, joint pain, joint swelling, myalgias, morning stiffness and myalgias. Negative for muscle weakness and muscle tenderness.  Skin: Negative for color change, pallor, rash, hair loss, nodules/bumps, skin tightness, ulcers and sensitivity to sunlight.  Allergic/Immunologic: Negative for susceptible to infections.  Neurological: Negative for dizziness, headaches and memory loss.  Hematological: Negative for swollen glands.  Psychiatric/Behavioral: Positive for sleep disturbance. Negative for depressed mood and confusion. The patient is not nervous/anxious.     PMFS History:  Patient Active Problem List   Diagnosis Date Noted  . Abdominal pain, chronic, epigastric   . Cholelithiasis with choledocholithiasis 08/22/2016  . Fibromyalgia   . Family history of premature CAD 07/04/2016    Past Medical History:  Diagnosis Date  . Anxiety   . Cholecystitis   . Depression   . Family history of premature CAD 07/04/2016  . Fibromyalgia   . Mouth ulcers     Family History  Problem Relation Age of Onset  . Colon polyps Mother   . Ovarian cysts Mother   . Heart disease Father 60  . High Cholesterol Father   . Hypertension Father   . Healthy Sister   . Diabetes Brother   . Healthy Daughter   . Healthy Son   . Diabetes Maternal Grandmother   . Colon polyps Maternal Grandmother   . Colitis Maternal Grandmother        ?  Marland Kitchen Irritable bowel syndrome Maternal Grandmother   . High Cholesterol Maternal Grandmother   . Pancreatic cancer Maternal Grandfather   . Diabetes Maternal  Grandfather   . Heart disease Paternal Grandmother   . High Cholesterol Paternal Grandmother   . Hypertension Paternal Grandmother   . Colon cancer Paternal Grandfather   . Heart disease Paternal Grandfather   . Healthy Daughter   . Healthy Son   . Heart attack Paternal Aunt        died 13s with mi  . Heart disease Paternal Aunt   . Heart attack Paternal Uncle         died in early 78 s MI    Past Surgical History:  Procedure Laterality Date  . CHOLECYSTECTOMY N/A 08/24/2016   Procedure: LAPAROSCOPIC CHOLECYSTECTOMY;  Surgeon: Georganna Skeans, MD;  Location: [facility];  Service: General;  Laterality: N/A;  . ERCP N/A 08/23/2016   Procedure: ENDOSCOPIC RETROGRADE CHOLANGIOPANCREATOGRAPHY (ERCP);  Surgeon: Doran Stabler, MD;  Location: [facility];  Service: Gastroenterology;  Laterality: N/A;  . GANGLION CYST EXCISION Right    wrist  . KNEE ARTHROSCOPY Right   . SHOULDER ARTHROSCOPY Right    Social History   Social History Narrative  . Not on file   Immunization History  Administered Date(s) Administered  . DTaP 03/23/1978, 04/22/1978, 05/23/1978, 08/03/1979, 10/16/1982  . IPV 03/23/1978, 05/23/1978, 07/21/1978, 08/03/1979  . Influenza Split 02/22/2013  . MMR 12/04/2015  . Measles 04/24/1979  . Mumps 04/24/1979  . Rubella 04/24/1979  . Tdap 12/04/2015     Objective: Vital Signs: BP 117/80 (BP Location: Left Arm, Patient Position: Sitting, Cuff Size: Normal)   Pulse 81   Resp 12   Ht _0  (1.778 m)   Wt 188 lb (85.3 kg)   BMI 26.98 kg/m    Physical Exam Vitals and nursing note reviewed.  Constitutional:      Appearance: She is well-developed.  HENT:     Head: Normocephalic and atraumatic.  Eyes:     Conjunctiva/sclera: Conjunctivae normal.  Cardiovascular:     Rate and Rhythm: Normal rate and regular rhythm.     Heart sounds: Normal heart sounds.  Pulmonary:     Effort: Pulmonary effort is normal.     Breath sounds: Normal breath sounds.  Abdominal:     General: Bowel sounds are normal.     Palpations: Abdomen is soft.  Musculoskeletal:     Cervical back: Normal range of motion.  Lymphadenopathy:     Cervical: No cervical adenopathy.  Skin:    General: Skin is warm and dry.     Capillary Refill: Capillary refill takes less than 2 seconds.  Neurological:     Mental Status: She is alert and oriented to person, place,  and time.  Psychiatric:        Behavior: Behavior normal.      Musculoskeletal Exam: C-spine, thoracic spine, and lumbar spine good ROM.  Tenderness over trapezius bilaterally.  Tenderness over lateral epicondyles bilaterally. Shoulder joints, elbow joints, wrist joints, MCPs, PIPs, and DIPs good ROM with no synovitis.  Hip joints, knee joints, ankle joints, MTPs, PIPs, and DIPs good ROM with no synovitis.  Moderate effusion of left knee and mild effusion of right knee.  No warmth or erythema of knee joints noted.  No tenderness or inflammation of both ankle joints. Bunions noted over bilateral 1st MTP joints.  CDAI Exam: CDAI Score: -- Patient Global: --; Provider Global: -- Swollen: --; Tender: -- Joint Exam 06/06/2019   No joint exam has been documented for this visit   There is currently no information documented on the homunculus. Go to  the Rheumatology activity and complete the homunculus joint exam.  Investigation: No additional findings.  Imaging: XR Foot 2 Views Left  Result Date: 06/06/2019 First MTP, PIP and DIP narrowing was noted.  No other MTPs showed narrowing.  No intertarsal or tibiotalar joint space narrowing was noted.  No erosive changes were noted. 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  XR Foot 2 Views Right  Result Date: 06/06/2019 First MTP, PIP and DIP narrowing was noted.  No other MTPs showed narrowing.  No intertarsal or tibiotalar joint space narrowing was noted.  No erosive changes were noted. 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  XR Hand 2 View Left  Result Date: 06/06/2019 No MCP, intercarpal radiocarpal joint space narrowing was noted.  Mild CMC narrowing was noted.  No PIP or DIP narrowing was noted.  No erosive changes were noted.  No chondrocalcinosis was noted. Impression: Mild CMC arthritis of the hand.  XR Hand 2 View Right  Result Date: 06/06/2019 No MCP, PIP  or DIP narrowing was noted.  No intercarpal radiocarpal joint space narrowing was noted.  No erosive changes were noted.  No chondrocalcinosis was noted. Impression: Unremarkable x-ray of the hand.  XR KNEE 3 VIEW LEFT  Result Date: 06/06/2019 No medial lateral compartment narrowing was noted.  No osteophytes were noted.  No chondrocalcinosis was noted.  No significant patellofemoral narrowing was noted. Impression: Unremarkable x-ray of the left knee.  XR KNEE 3 VIEW RIGHT  Result Date: 06/06/2019 Moderate lateral compartment narrowing was noted.  Lateral osteophyte and intercondylar osteophytes were noted.  No chondrocalcinosis was noted.  Mild patellofemoral narrowing was noted. 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  XR Pelvis 1-2 Views  Result Date: 06/06/2019 No SI joint to sclerosis or narrowing was noted.  Bilateral hip joints did not show any significant narrowing. Impression: Unremarkable x-ray of the SI joints.   Recent Labs: Lab Results  Component Value Date   WBC 7.6 03/06/2019   HGB 12.6 03/06/2019   PLT 361 03/06/2019   NA 142 03/06/2019   K 4.1 03/06/2019   CL 107 (H) 03/06/2019   CO2 24 03/06/2019   GLUCOSE 86 03/06/2019   BUN 7 03/06/2019   CREATININE 0.73 03/06/2019   BILITOT 0.4 03/06/2019   ALKPHOS 70 03/06/2019   AST 11 03/06/2019   ALT 10 03/06/2019   PROT 6.3 03/06/2019   ALBUMIN 4.1 03/06/2019   CALCIUM 9.0 03/06/2019   GFRAA 118 03/06/2019  April 27, 2019 iron studies showed percent saturation 54 mildly increased, ferritin normal, ANCA negative, MPO negative, serine proteinase 3 -, antigliadin negative, anti-tTG negative, CK 64, TSH normal, sed rate 2, vitamin D 26, ACE negative, uric acid 3.7, dsDNA negative, Smith negative, RF negative, _0 eta negative.  Speciality Comments: No specialty comments available.  Procedures:  Large Joint Inj: L knee on 06/06/2019 3:25  PM Indications: pain Details: 27 G 1.5 in needle, medial approach  Arthrogram: No  Medications: 1.5 mL lidocaine 1 %; 40 mg triamcinolone acetonide 40 MG/ML Aspirate: 0 mL Outcome: tolerated well, no immediate complications Procedure, treatment alternatives, risks and benefits explained, specific risks discussed. Consent was given by the patient. Immediately prior to procedure a time out was called to verify the correct patient, procedure, equipment, support staff and site/side marked as required. Patient was prepped and draped in the usual sterile fashion.     Allergies: Patient has  no known allergies.    Assessment / Plan:     Visit Diagnoses: Polyarthralgia - History of episodic intermittent pain for the last 12 years.  All autoimmune work-up was negative.  History of right wrist flexor tenosynovitis and bilateral knee joint effusions.  She showed me a picture of her right wrist flexor tenosynovitis on the iPhone.  Use of natural anti-inflammatories were discussed.  I have also advised her to contact me if she develops any acute swelling.  She will follow up in 3 months.   Pain in both hands -She has intermittent pain in bilateral hands.  No tenderness or synovitis was noted on exam today.  She has complete fist formation bilaterally.  X-rays of both hands were obtained today.  Plan: XR Hand 2 View Left, XR Hand 2 View Right.  X-ray of bilateral hands are unremarkable except for mild osteoarthritic changes in the left CMC joint.  Pain in both feet -She has chronic pain in bilateral feet.  Bunions noted bilaterally.  No synovitis was noted.  She has not had any plantar fasciitis or Achilles tendinitis.  X-rays of both feet were obtained today.  Plan: XR Foot 2 Views Left, XR Foot 2 Views Right.  X-ray showed osteoarthritic changes in the first MTP joint and PIP and DIP joints.  Chronic pain of both knees -She has chronic constant pain in bilateral knee joints.  X-rays of both knees were  obtained today.  Plan: XR KNEE 3 VIEW LEFT, XR KNEE 3 VIEW RIGHT.  Right knee joint showed moderate lateral compartment narrowing and mild chondromalacia patella.  Left knee joint x-ray was unremarkable.  Patient gives history of right knee joint arthroscopic surgery in the past due to meniscal tear repair in 2006.  Effusion of left knee joint - Mild: She has been experiencing chronic constant pain in the left knee joint.  She has painful range of motion of the left knee joint.  She experiences difficulty when climbing steps and getting up from a seated position due to the discomfort and stiffness in the left knee joint.  A mild effusion was noted on exam today.  No warmth or erythema was noted.  A left knee joint aspiration was attempted but no fluid was drawn off so we are unable to perform synovial analysis. The left knee joint was injected with cortisone.  She tolerated the procedure well.  Procedure note completed above.  She was given a list of natural antiinflammatories.    Effusion of right knee - Mild: She has chronic and constant right knee joint pain.  She has good range of motion of the right knee on exam.  She has a mild right knee joint effusion but no warmth erythema is noted.  Chronic right SI joint pain -She has tenderness over the right SI joint on exam today.  She experiences intermittent discomfort in the right SI joint.  She states that when she is having a flare she experiences severe pain which limits her ability to walk.  X-rays of the pelvis were obtained.  Plan: XR Pelvis 1-2 Views.  X-ray of the SI joints are unremarkable.  Trochanteric bursitis of both hips: She has tenderness over bilateral trochanteric bursa.  She was given handout of stretching exercises to perform  Fibromyalgia -She has generalized hyperalgesia and positive tender points on exam.  She had an inadequate response to Lyrica in the past.  She experiences bouts of myalgias and arthralgias on a regular basis.  We  discussed the  importance of regular exercise and good sleep hygiene.  Other fatigue: Chronic and related to insomnia.  Primary insomnia: She experiences intermittent nocturnal pain which contributes to insomnia.  Vitamin D deficiency: Vitamin D was 26 on 04/27/2019.  She was started on vitamin D 50,000 units by mouth once weekly.  We will recheck vitamin D in 3 months.  Future orders in place.  Recurrent oral ulcers - Since childhood.    Other medical conditions are listed as follows:   Cholelithiasis with choledocholithiasis  Family history of premature CAD    Orders: Orders Placed This Encounter  Procedures  . Large Joint Inj  . XR KNEE 3 VIEW LEFT  . XR KNEE 3 VIEW RIGHT  . XR Pelvis 1-2 Views  . XR Hand 2 View Left  . XR Hand 2 View Right  . XR Foot 2 Views Left  . XR Foot 2 Views Right   No orders of the defined types were placed in this encounter.   Face-to-face time spent with patient was 45 minutes. Greater than 50% of time was spent in counseling and coordination of care.  Follow-Up Instructions: Return in about 3 months (around 09/04/2019) for Inflammatory arthritis.   Bo Merino, MD   Scribed by-  Hazel Sams, PA-C  Note - This record has been created using Dragon software.  Chart creation errors have been sought, but may not always  have been located. Such creation errors do not reflect on  the standard of medical care.

## 2019-06-06 ENCOUNTER — Ambulatory Visit: Payer: BC Managed Care – PPO | Admitting: Rheumatology

## 2019-06-06 ENCOUNTER — Ambulatory Visit: Payer: Self-pay

## 2019-06-06 ENCOUNTER — Encounter: Payer: Self-pay | Admitting: Rheumatology

## 2019-06-06 ENCOUNTER — Other Ambulatory Visit: Payer: Self-pay

## 2019-06-06 VITALS — BP 117/80 | HR 81 | Resp 12 | Ht 70.0 in | Wt 188.0 lb

## 2019-06-06 DIAGNOSIS — M79671 Pain in right foot: Secondary | ICD-10-CM

## 2019-06-06 DIAGNOSIS — G8929 Other chronic pain: Secondary | ICD-10-CM

## 2019-06-06 DIAGNOSIS — M25462 Effusion, left knee: Secondary | ICD-10-CM

## 2019-06-06 DIAGNOSIS — M25561 Pain in right knee: Secondary | ICD-10-CM | POA: Diagnosis not present

## 2019-06-06 DIAGNOSIS — K1379 Other lesions of oral mucosa: Secondary | ICD-10-CM

## 2019-06-06 DIAGNOSIS — E559 Vitamin D deficiency, unspecified: Secondary | ICD-10-CM

## 2019-06-06 DIAGNOSIS — R5383 Other fatigue: Secondary | ICD-10-CM

## 2019-06-06 DIAGNOSIS — F5101 Primary insomnia: Secondary | ICD-10-CM

## 2019-06-06 DIAGNOSIS — M2550 Pain in unspecified joint: Secondary | ICD-10-CM

## 2019-06-06 DIAGNOSIS — M25562 Pain in left knee: Secondary | ICD-10-CM

## 2019-06-06 DIAGNOSIS — M79642 Pain in left hand: Secondary | ICD-10-CM

## 2019-06-06 DIAGNOSIS — M7061 Trochanteric bursitis, right hip: Secondary | ICD-10-CM

## 2019-06-06 DIAGNOSIS — M79641 Pain in right hand: Secondary | ICD-10-CM

## 2019-06-06 DIAGNOSIS — M79672 Pain in left foot: Secondary | ICD-10-CM

## 2019-06-06 DIAGNOSIS — M25461 Effusion, right knee: Secondary | ICD-10-CM

## 2019-06-06 DIAGNOSIS — M7062 Trochanteric bursitis, left hip: Secondary | ICD-10-CM

## 2019-06-06 DIAGNOSIS — K8070 Calculus of gallbladder and bile duct without cholecystitis without obstruction: Secondary | ICD-10-CM

## 2019-06-06 DIAGNOSIS — M797 Fibromyalgia: Secondary | ICD-10-CM

## 2019-06-06 DIAGNOSIS — Z8249 Family history of ischemic heart disease and other diseases of the circulatory system: Secondary | ICD-10-CM

## 2019-06-06 DIAGNOSIS — M533 Sacrococcygeal disorders, not elsewhere classified: Secondary | ICD-10-CM

## 2019-06-06 NOTE — Patient Instructions (Signed)
Hip Bursitis Rehab Ask your health care provider which exercises are safe for you. Do exercises exactly as told by your health care provider and adjust them as directed. It is normal to feel mild stretching, pulling, tightness, or discomfort as you do these exercises. Stop right away if you feel sudden pain or your pain gets worse. Do not begin these exercises until told by your health care provider. Stretching exercise This exercise warms up your muscles and joints and improves the movement and flexibility of your hip. This exercise also helps to relieve pain and stiffness. Iliotibial band stretch An iliotibial band is a strong band of muscle tissue that runs from the outer side of your hip to the outer side of your thigh and knee. 1. Lie on your side with your left / right leg in the top position. 2. Bend your left / right knee and grab your ankle. Stretch out your bottom arm to help you balance. 3. Slowly bring your knee back so your thigh is behind your body. 4. Slowly lower your knee toward the floor until you feel a gentle stretch on the outside of your left / right thigh. If you do not feel a stretch and your knee will not fall farther, place the heel of your other foot on top of your knee and pull your knee down toward the floor with your foot. 5. Hold this position for __________ seconds. 6. Slowly return to the starting position. Repeat __________ times. Complete this exercise __________ times a day. Strengthening exercises These exercises build strength and endurance in your hip and pelvis. Endurance is the ability to use your muscles for a long time, even after they get tired. Bridge This exercise strengthens the muscles that move your thigh backward (hip extensors). 1. Lie on your back on a firm surface with your knees bent and your feet flat on the floor. 2. Tighten your buttocks muscles and lift your buttocks off the floor until your trunk is level with your thighs. ? Do not arch  your back. ? You should feel the muscles working in your buttocks and the back of your thighs. If you do not feel these muscles, slide your feet 1-2 inches (2.5-5 cm) farther away from your buttocks. ? If this exercise is too easy, try doing it with your arms crossed over your chest. 3. Hold this position for __________ seconds. 4. Slowly lower your hips to the starting position. 5. Let your muscles relax completely after each repetition. Repeat __________ times. Complete this exercise __________ times a day. Squats This exercise strengthens the muscles in front of your thigh and knee (quadriceps). 1. Stand in front of a table, with your feet and knees pointing straight ahead. You may rest your hands on the table for balance but not for support. 2. Slowly bend your knees and lower your hips like you are going to sit in a chair. ? Keep your weight over your heels, not over your toes. ? Keep your lower legs upright so they are parallel with the table legs. ? Do not let your hips go lower than your knees. ? Do not bend lower than told by your health care provider. ? If your hip pain increases, do not bend as low. 3. Hold the squat position for __________ seconds. 4. Slowly push with your legs to return to standing. Do not use your hands to pull yourself to standing. Repeat __________ times. Complete this exercise __________ times a day. Hip hike 1. Stand   sideways on a bottom step. Stand on your left / right leg with your other foot unsupported next to the step. You can hold on to the railing or wall for balance if needed. 2. Keep your knees straight and your torso square. Then lift your left / right hip up toward the ceiling. 3. Hold this position for __________ seconds. 4. Slowly let your left / right hip lower toward the floor, past the starting position. Your foot should get closer to the floor. Do not lean or bend your knees. Repeat __________ times. Complete this exercise __________ times a  day. Single leg stand 1. Without shoes, stand near a railing or in a doorway. You may hold on to the railing or door frame as needed for balance. 2. Squeeze your left / right buttock muscles, then lift up your other foot. ? Do not let your left / right hip push out to the side. ? It is helpful to stand in front of a mirror for this exercise so you can watch your hip. 3. Hold this position for __________ seconds. Repeat __________ times. Complete this exercise __________ times a day. This information is not intended to replace advice given to you by your health care provider. Make sure you discuss any questions you have with your health care provider. Document Revised: 08/21/2018 Document Reviewed: 08/21/2018 Elsevier Patient Education  2020 [employer]  

## 2019-07-15 ENCOUNTER — Ambulatory Visit: Payer: BC Managed Care – PPO

## 2019-09-03 NOTE — Progress Notes (Deleted)
Office Visit Note  Patient: Autumn Dixon             Date of Birth: [birth year]           MRN: [id number]             PCP: Horald Pollen, MD Referring: Horald Pollen, * Visit Date: 09/05/2019 Occupation: '@GUAROCC'$ @  Subjective:  No chief complaint on file.   History of Present Illness: Autumn Dixon is a 44 y.o. female ***   Activities of Daily Living:  Patient reports morning stiffness for *** {minute/hour:19697}.   Patient {ACTIONS;DENIES/REPORTS:21021675::"Denies"} nocturnal pain.  Difficulty dressing/grooming: {ACTIONS;DENIES/REPORTS:21021675::"Denies"} Difficulty climbing stairs: {ACTIONS;DENIES/REPORTS:21021675::"Denies"} Difficulty getting out of chair: {ACTIONS;DENIES/REPORTS:21021675::"Denies"} Difficulty using hands for taps, buttons, cutlery, and/or writing: {ACTIONS;DENIES/REPORTS:21021675::"Denies"}  No Rheumatology ROS completed.   PMFS History:  Patient Active Problem List   Diagnosis Date Noted  . Abdominal pain, chronic, epigastric   . Cholelithiasis with choledocholithiasis 08/22/2016  . Fibromyalgia   . Family history of premature CAD 07/04/2016    Past Medical History:  Diagnosis Date  . Anxiety   . Cholecystitis   . Depression   . Family history of premature CAD 07/04/2016  . Fibromyalgia   . Mouth ulcers     Family History  Problem Relation Age of Onset  . Colon polyps Mother   . Ovarian cysts Mother   . Heart disease Father 51  . High Cholesterol Father   . Hypertension Father   . Healthy Sister   . Diabetes Brother   . Healthy Daughter   . Healthy Son   . Diabetes Maternal Grandmother   . Colon polyps Maternal Grandmother   . Colitis Maternal Grandmother        ?  Marland Kitchen Irritable bowel syndrome Maternal Grandmother   . High Cholesterol Maternal Grandmother   . Pancreatic cancer Maternal Grandfather   . Diabetes Maternal Grandfather   . Heart disease Paternal Grandmother   . High Cholesterol Paternal Grandmother   .  Hypertension Paternal Grandmother   . Colon cancer Paternal Grandfather   . Heart disease Paternal Grandfather   . Healthy Daughter   . Healthy Son   . Heart attack Paternal Aunt        died 39s with mi  . Heart disease Paternal Aunt   . Heart attack Paternal Uncle        died in early 11 s MI    Past Surgical History:  Procedure Laterality Date  . CHOLECYSTECTOMY N/A 08/24/2016   Procedure: LAPAROSCOPIC CHOLECYSTECTOMY;  Surgeon: Georganna Skeans, MD;  Location: [place];  Service: General;  Laterality: N/A;  . ERCP N/A 08/23/2016   Procedure: ENDOSCOPIC RETROGRADE CHOLANGIOPANCREATOGRAPHY (ERCP);  Surgeon: Doran Stabler, MD;  Location: [facility];  Service: Gastroenterology;  Laterality: N/A;  . GANGLION CYST EXCISION Right    wrist  . KNEE ARTHROSCOPY Right   . SHOULDER ARTHROSCOPY Right    Social History   Social History Narrative  . Not on file   Immunization History  Administered Date(s) Administered  . DTaP 03/23/1978, 04/22/1978, 05/23/1978, 08/03/1979, 10/16/1982  . IPV 03/23/1978, 05/23/1978, 07/21/1978, 08/03/1979  . Influenza Split 02/22/2013  . MMR 12/04/2015  . Measles 04/24/1979  . Mumps 04/24/1979  . Rubella 04/24/1979  . Tdap 12/04/2015     Objective: Vital Signs: There were no vitals taken for this visit.   Physical Exam   Musculoskeletal Exam: ***  CDAI Exam: CDAI Score: -- Patient Global: --; Provider Global: -- Swollen: --;  Tender: -- Joint Exam 09/05/2019   No joint exam has been documented for this visit   There is currently no information documented on the homunculus. Go to the Rheumatology activity and complete the homunculus joint exam.  Investigation: No additional findings.  Imaging: No results found.  Recent Labs: Lab Results  Component Value Date   WBC 7.6 03/06/2019   HGB 12.6 03/06/2019   PLT 361 03/06/2019   NA 142 03/06/2019   K 4.1 03/06/2019   CL 107 (H) 03/06/2019   CO2 24 03/06/2019   GLUCOSE 86 03/06/2019    BUN 7 03/06/2019   CREATININE 0.73 03/06/2019   BILITOT 0.4 03/06/2019   ALKPHOS 70 03/06/2019   AST 11 03/06/2019   ALT 10 03/06/2019   PROT 6.3 03/06/2019   ALBUMIN 4.1 03/06/2019   CALCIUM 9.0 03/06/2019   GFRAA 118 03/06/2019    Speciality Comments: No specialty comments available.  Procedures:  No procedures performed Allergies: Patient has no known allergies.   Assessment / Plan:     Visit Diagnoses: No diagnosis found.  Orders: No orders of the defined types were placed in this encounter.  No orders of the defined types were placed in this encounter.   Face-to-face time spent with patient was *** minutes. Greater than 50% of time was spent in counseling and coordination of care.  Follow-Up Instructions: No follow-ups on file.   Ofilia Neas, PA-C  Note - This record has been created using Dragon software.  Chart creation errors have been sought, but may not always  have been located. Such creation errors do not reflect on  the standard of medical care.

## 2019-09-04 ENCOUNTER — Telehealth: Payer: Self-pay | Admitting: Rheumatology

## 2019-09-04 DIAGNOSIS — E559 Vitamin D deficiency, unspecified: Secondary | ICD-10-CM

## 2019-09-04 NOTE — Telephone Encounter (Signed)
Patient called requesting her labwork orders to recheck the levels of her Vitamin D to [facility] on [address].  Patient states she plans to go tomorrow or Thursday.

## 2019-09-04 NOTE — Telephone Encounter (Signed)
Lab order released for vitamin D for [facility]. Patient is aware.

## 2019-09-05 ENCOUNTER — Ambulatory Visit: Payer: BC Managed Care – PPO | Admitting: Rheumatology

## 2019-09-20 ENCOUNTER — Other Ambulatory Visit: Payer: Self-pay | Admitting: Obstetrics and Gynecology

## 2019-09-20 ENCOUNTER — Ambulatory Visit
Admission: RE | Admit: 2019-09-20 | Discharge: 2019-09-20 | Disposition: A | Discharge status: Home / Self Care | Payer: BC Managed Care – PPO | Source: Ambulatory Visit | Attending: Obstetrics and Gynecology | Admitting: Obstetrics and Gynecology

## 2019-09-20 ENCOUNTER — Other Ambulatory Visit: Payer: Self-pay

## 2019-09-20 DIAGNOSIS — N6489 Other specified disorders of breast: Secondary | ICD-10-CM

## 2019-09-21 LAB — VITAMIN D 25 HYDROXY (VIT D DEFICIENCY, FRACTURES): Vit D, 25-Hydroxy: 27 ng/mL — ABNORMAL LOW (ref 30–100)

## 2019-09-21 NOTE — Telephone Encounter (Signed)
Vitamin D is a still low.  Please advised patient to take vitamin D 2000 units daily.  We will recheck vitamin D level in 6 months.

## 2019-09-23 NOTE — Telephone Encounter (Signed)
Please schedule next available appointment, not urgent.

## 2020-03-25 ENCOUNTER — Ambulatory Visit
Admission: RE | Admit: 2020-03-25 | Discharge: 2020-03-25 | Disposition: A | Discharge status: Home / Self Care | Payer: BC Managed Care – PPO | Source: Ambulatory Visit | Attending: Obstetrics and Gynecology | Admitting: Obstetrics and Gynecology

## 2020-03-25 ENCOUNTER — Other Ambulatory Visit: Payer: Self-pay | Admitting: Obstetrics and Gynecology

## 2020-03-25 ENCOUNTER — Other Ambulatory Visit: Payer: Self-pay

## 2020-03-25 DIAGNOSIS — N6489 Other specified disorders of breast: Secondary | ICD-10-CM

## 2020-03-25 DIAGNOSIS — R921 Mammographic calcification found on diagnostic imaging of breast: Secondary | ICD-10-CM

## 2020-03-28 ENCOUNTER — Other Ambulatory Visit: Payer: Self-pay | Admitting: Obstetrics and Gynecology

## 2020-04-09 HISTORY — PX: AUGMENTATION MAMMAPLASTY: SUR837

## 2020-04-09 HISTORY — PX: REDUCTION MAMMAPLASTY: SUR839

## 2020-04-28 ENCOUNTER — Other Ambulatory Visit: Payer: Self-pay | Admitting: Oral and Maxillofacial Surgery

## 2020-07-08 HISTORY — PX: AUGMENTATION MAMMAPLASTY: SUR837

## 2020-08-14 IMAGING — US US BREAST*R* LIMITED INC AXILLA
1 series · 7 of 7 positions shown · non-contrast
Comparison: Previous exam(s).

CLINICAL DATA: 43-year-old female presenting for annual exam as
well as follow-up of probably benign right breast calcifications and
a probably benign right breast asymmetries.

EXAM:
DIGITAL DIAGNOSTIC BILATERAL MAMMOGRAM WITH CAD AND TOMO
ULTRASOUND RIGHT BREAST

[Series 1: us breast*right* limited inc axilla · 0.05mm/px · 7 of 7 slices shown]
[im 1/7]
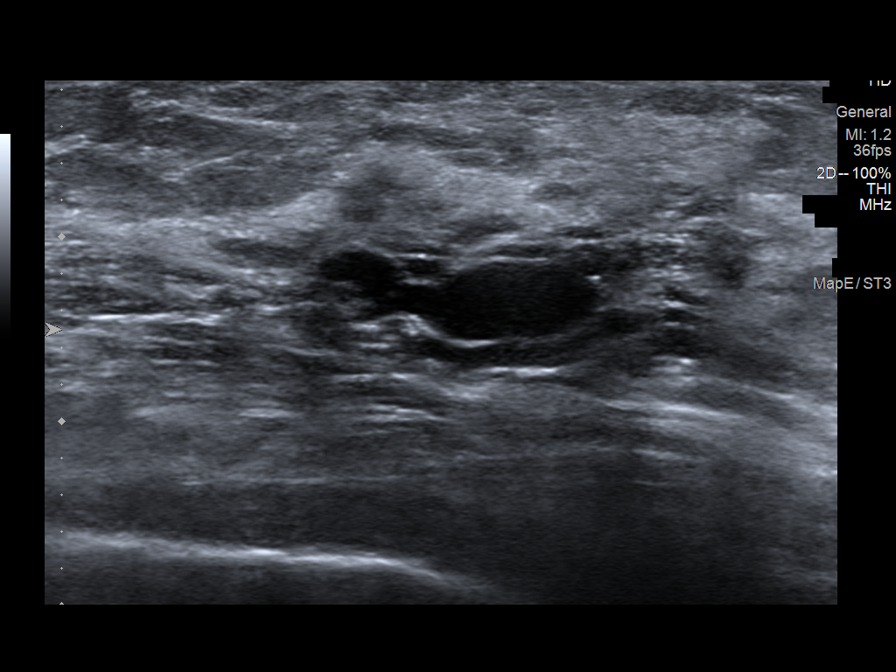
[im 2/7]
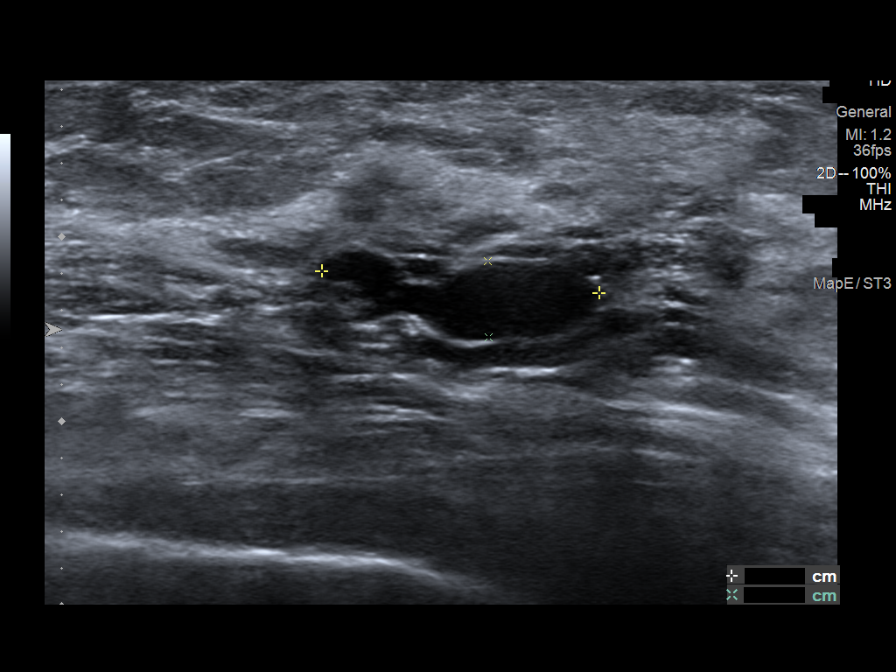
[im 3/7]
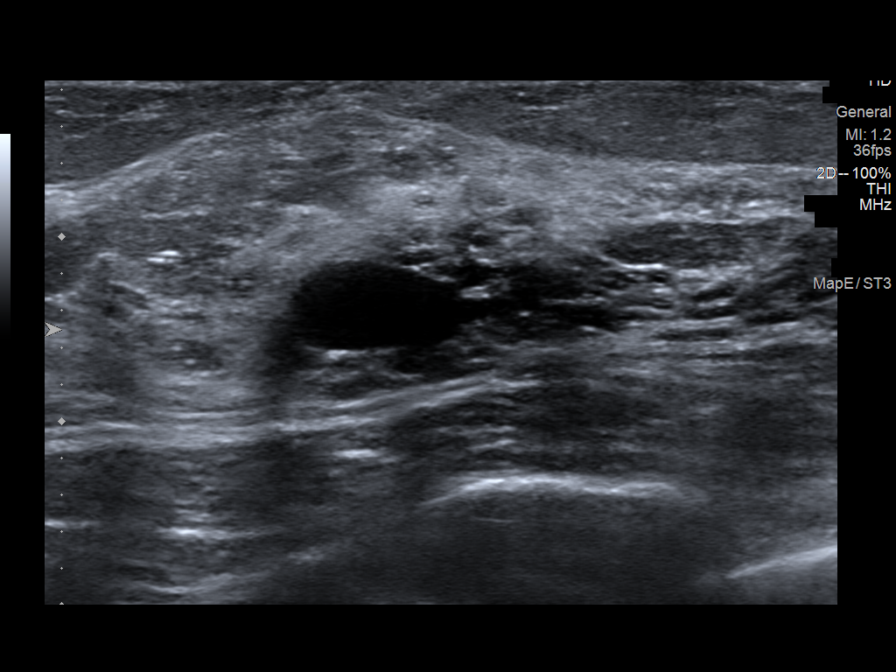
[im 4/7]
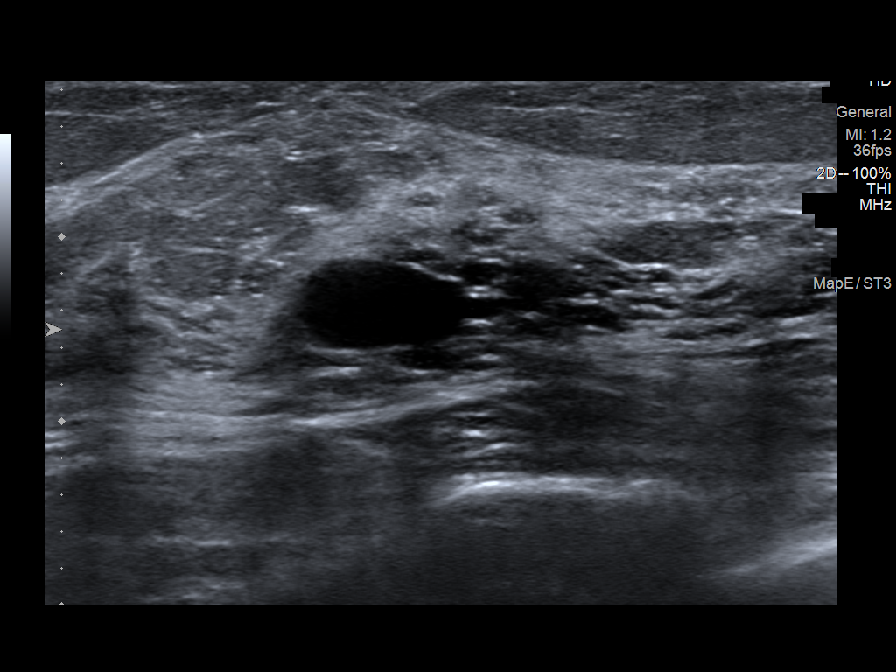
[im 5/7]
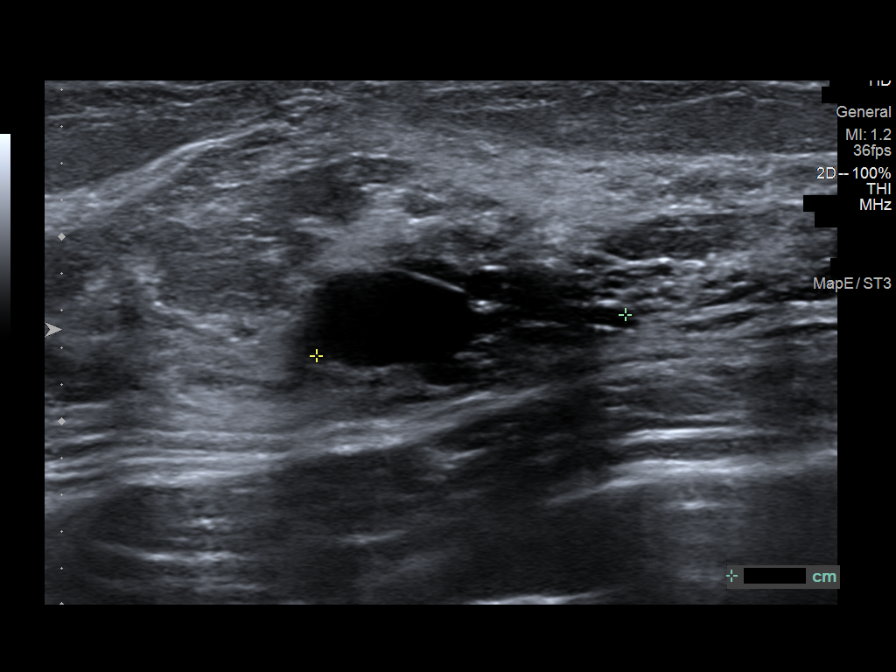
[im 6/7]
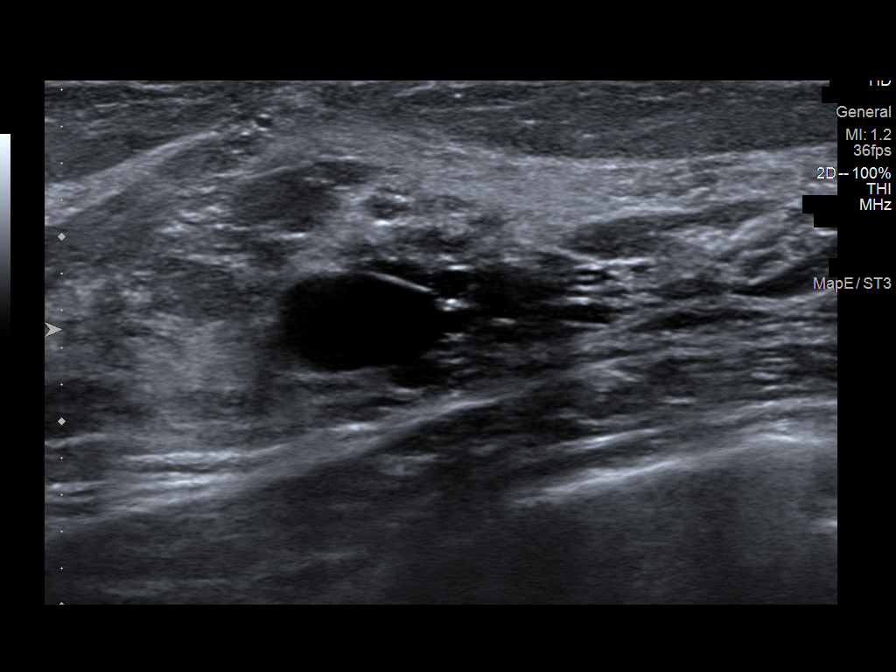
[im 7/7]
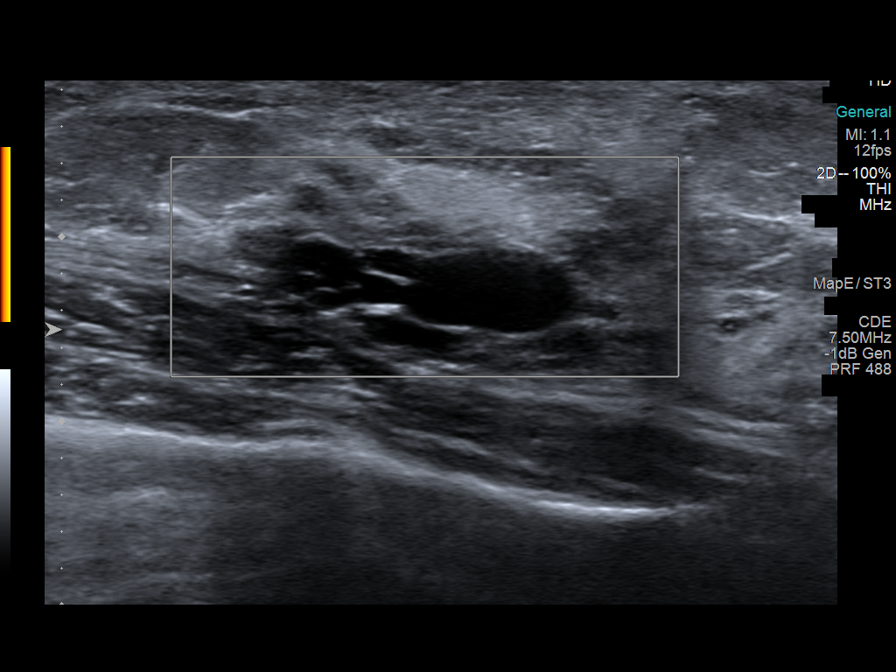

[7 of 7 positions shown; findings below may reference images not displayed]

ACR Breast Density Category c: The breast tissue is heterogeneously
dense, which may obscure small masses.
FINDINGS: Mammogram:

Right breast:

Full field and spot 2D magnification views of the right breast were
performed.

The asymmetry previously seen within superomedial right breast is
less conspicuous compared to the prior study, consistent with
overlapping fibroglandular tissue.

The focal asymmetry in the upper-outer quadrant of the right breast
is not significantly changed compared to the prior study. There are
grouped round and amorphous calcifications associated with this
asymmetry which are also stable.

There are no new findings in the right breast.

Left breast:

Full field and spot 2D magnification views of the left breast were
performed.

There are grouped faint amorphous calcifications in the outer left
breast spanning approximately 0.8 cm, best seen on the full field
and mag cc views.

There are no additional findings in the left breast.

Mammographic images were processed with CAD.

Ultrasound:

Targeted ultrasound is performed in the right breast at 10 o'clock 9
cm from the nipple demonstrating a cluster of anechoic cysts overall
measuring 1.5 x 0.4 x 1.7 cm, previously measuring 1.5 x 0.5 x
cm. No new suspicious solid component. No internal blood flow.
IMPRESSION: 1. Stable probably benign asymmetry in the upper-outer quadrant of
the right breast with associated calcifications. This likely
corresponds to a cluster of cysts/apocrine metaplasia seen
sonographically measuring 1.7 cm

2. Grouped calcifications in the outer left breast spanning 0.8 cm
are probably benign.

RECOMMENDATION:
1. Diagnostic left breast mammogram in 6 months for the probably
benign calcifications.

2. Diagnostic right breast mammogram and ultrasound in 1 year for
the probably benign asymmetry and calcifications.

I have discussed the findings and recommendations with the patient.
If applicable, a reminder letter will be sent to the patient
regarding the next appointment.

BI-RADS CATEGORY  3: Probably benign.

## 2020-08-14 IMAGING — MG DIGITAL DIAGNOSTIC BILAT W/ TOMO W/ CAD
8 of 14 series · 8 of 30 positions shown · non-contrast
Comparison: Previous exam(s).

CLINICAL DATA: 43-year-old female presenting for annual exam as
well as follow-up of probably benign right breast calcifications and
a probably benign right breast asymmetries.

EXAM:
DIGITAL DIAGNOSTIC BILATERAL MAMMOGRAM WITH CAD AND TOMO
ULTRASOUND RIGHT BREAST

[L ML (1 of 3)]
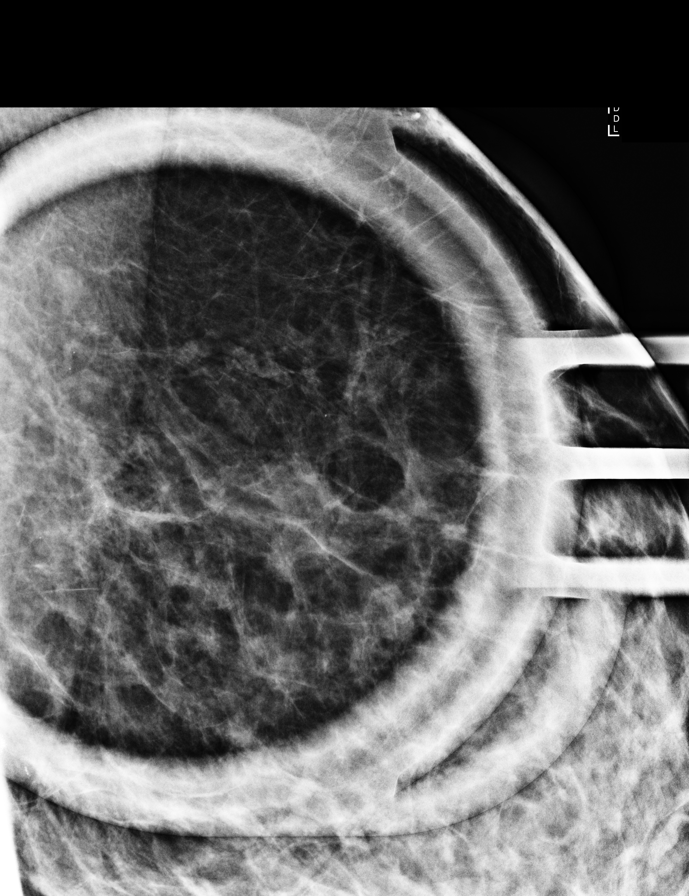

[R CC]
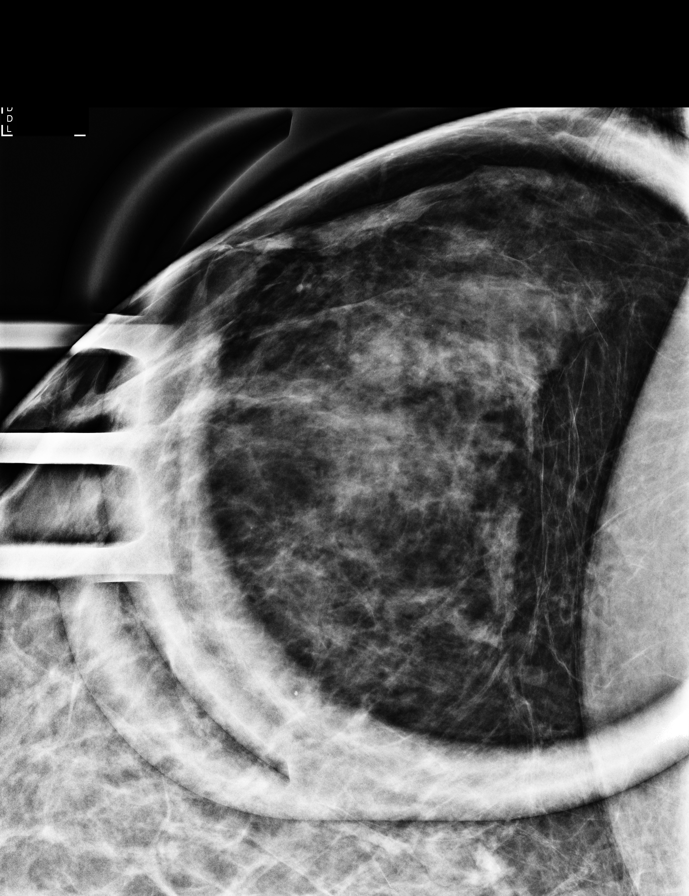

[L ML (2 of 3)]
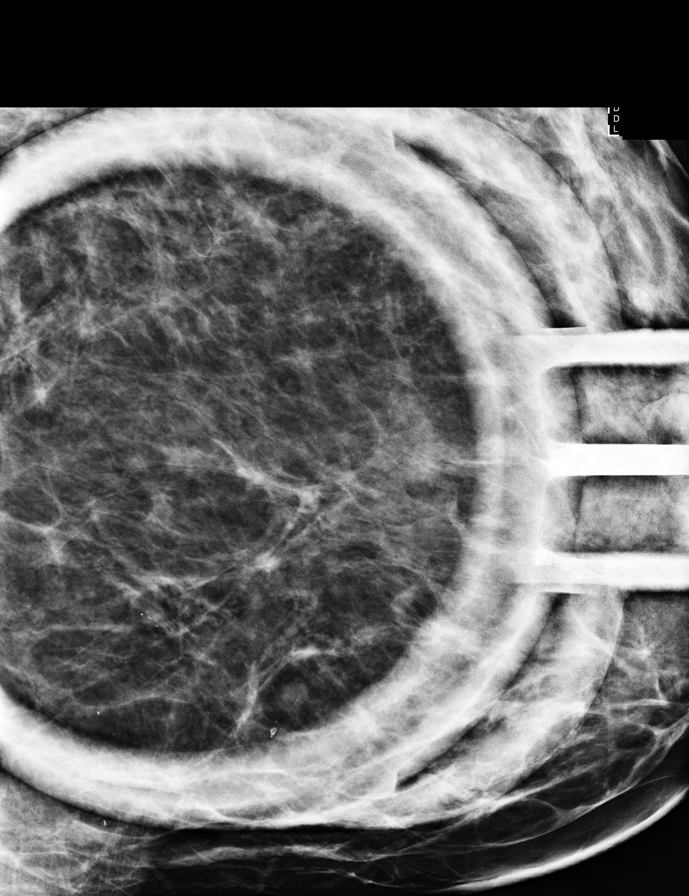

[L CC]
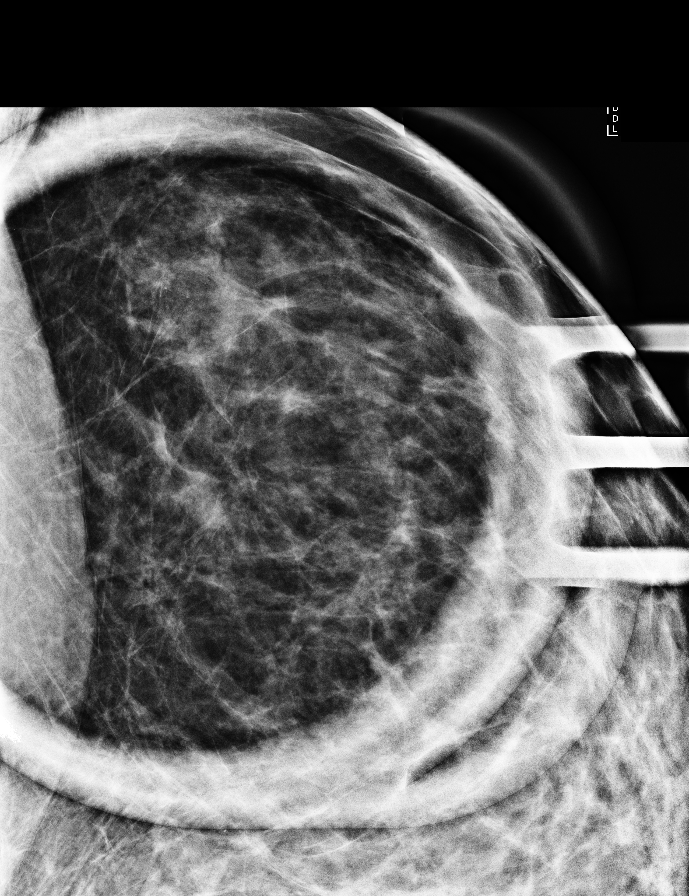

[R ML]
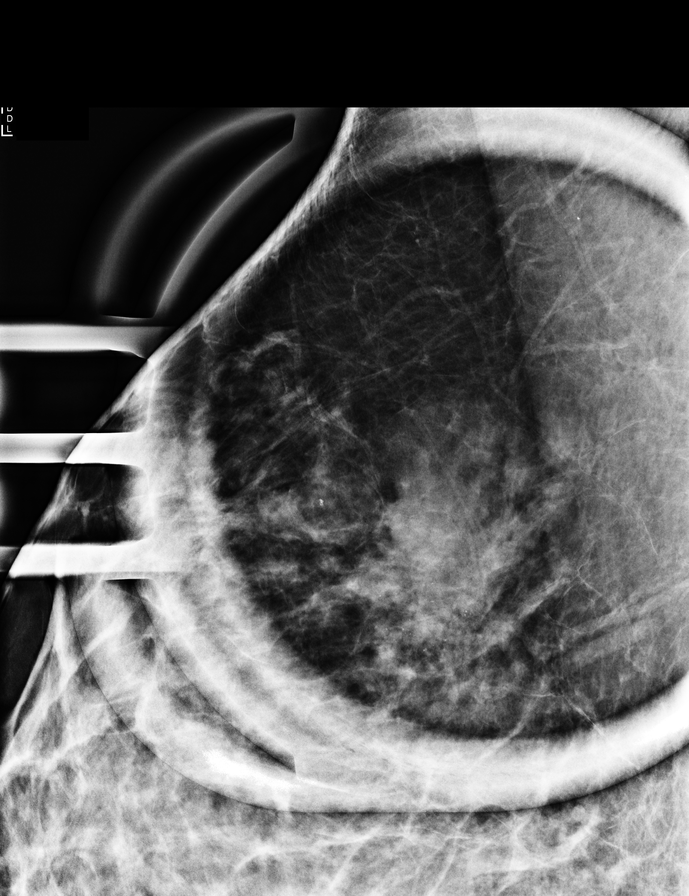

[L ML (3 of 3)]
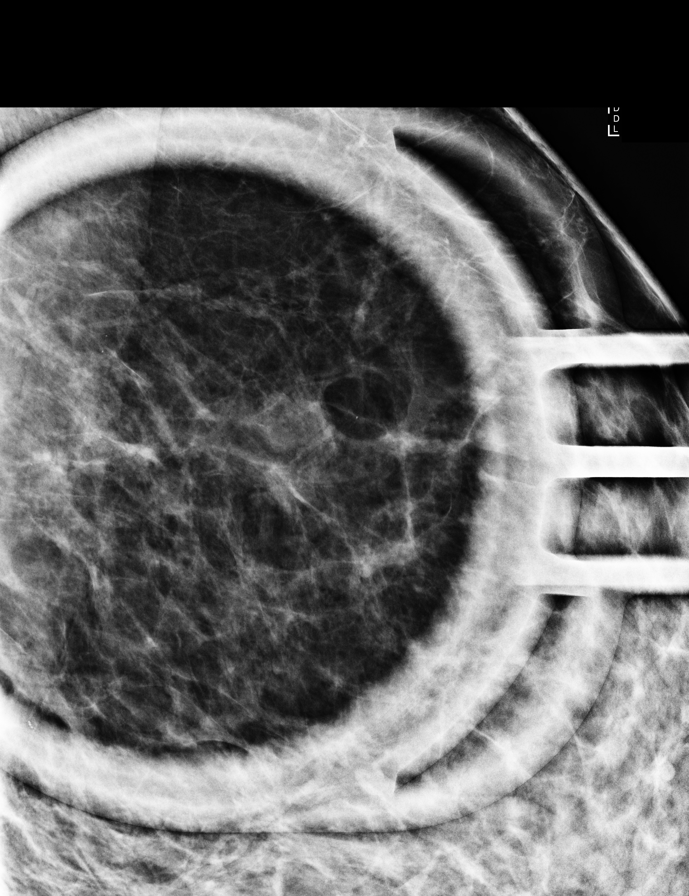

[L CC synth-2D]
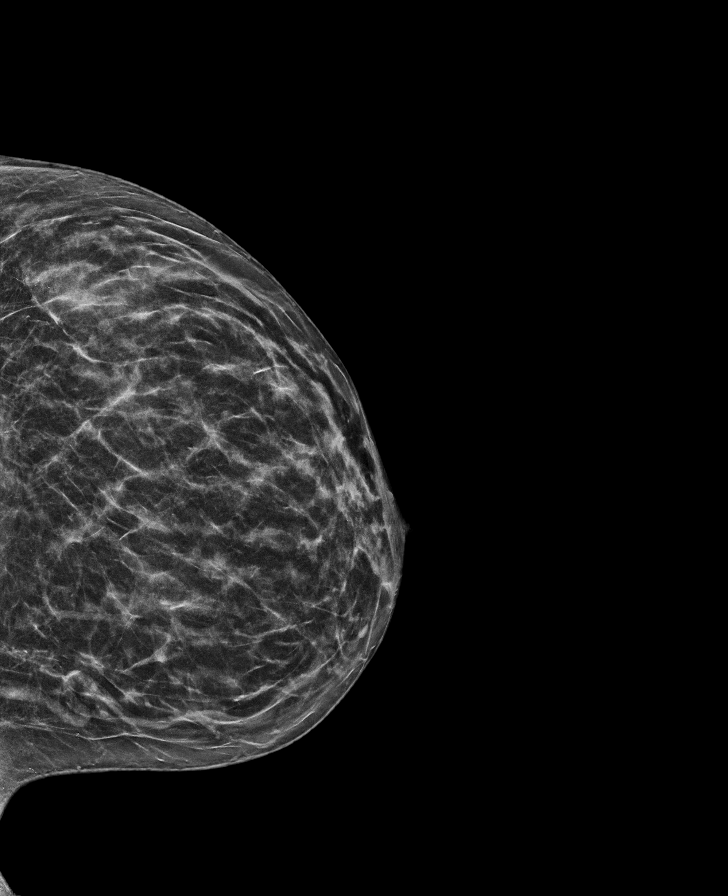

[L MLO synth-2D]
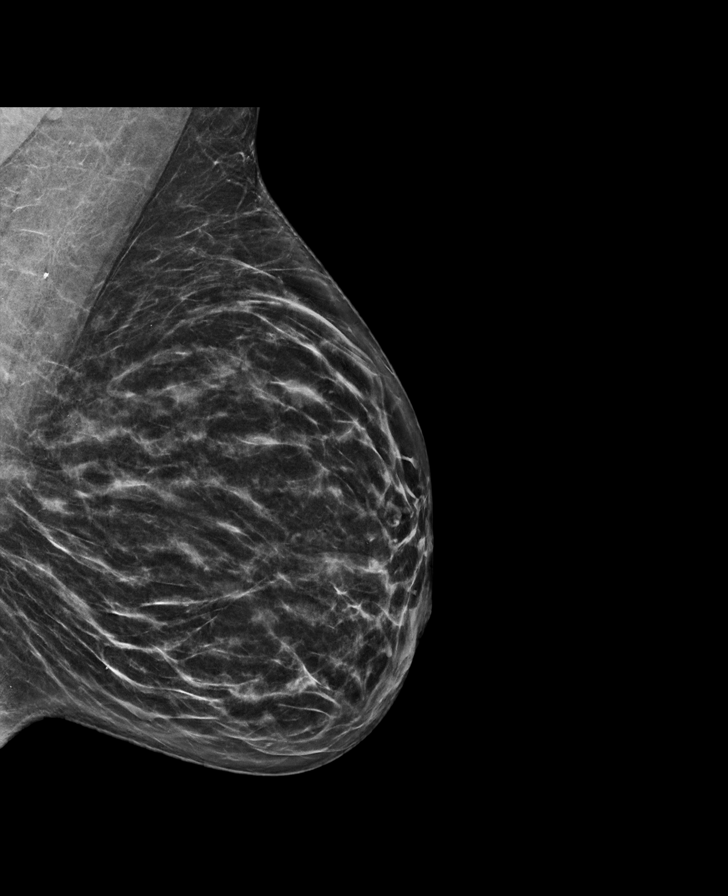

[8 of 30 positions shown; findings below may reference images not displayed]

ACR Breast Density Category c: The breast tissue is heterogeneously
dense, which may obscure small masses.
FINDINGS: Mammogram:

Right breast:

Full field and spot 2D magnification views of the right breast were
performed.

The asymmetry previously seen within superomedial right breast is
less conspicuous compared to the prior study, consistent with
overlapping fibroglandular tissue.

The focal asymmetry in the upper-outer quadrant of the right breast
is not significantly changed compared to the prior study. There are
grouped round and amorphous calcifications associated with this
asymmetry which are also stable.

There are no new findings in the right breast.

Left breast:

Full field and spot 2D magnification views of the left breast were
performed.

There are grouped faint amorphous calcifications in the outer left
breast spanning approximately 0.8 cm, best seen on the full field
and mag cc views.

There are no additional findings in the left breast.

Mammographic images were processed with CAD.

Ultrasound:

Targeted ultrasound is performed in the right breast at 10 o'clock 9
cm from the nipple demonstrating a cluster of anechoic cysts overall
measuring 1.5 x 0.4 x 1.7 cm, previously measuring 1.5 x 0.5 x
cm. No new suspicious solid component. No internal blood flow.
IMPRESSION: 1. Stable probably benign asymmetry in the upper-outer quadrant of
the right breast with associated calcifications. This likely
corresponds to a cluster of cysts/apocrine metaplasia seen
sonographically measuring 1.7 cm

2. Grouped calcifications in the outer left breast spanning 0.8 cm
are probably benign.

RECOMMENDATION:
1. Diagnostic left breast mammogram in 6 months for the probably
benign calcifications.

2. Diagnostic right breast mammogram and ultrasound in 1 year for
the probably benign asymmetry and calcifications.

I have discussed the findings and recommendations with the patient.
If applicable, a reminder letter will be sent to the patient
regarding the next appointment.

BI-RADS CATEGORY  3: Probably benign.

## 2020-09-22 ENCOUNTER — Other Ambulatory Visit: Payer: BC Managed Care – PPO

## 2021-08-08 HISTORY — PX: REDUCTION MAMMAPLASTY: SUR839

## 2021-08-08 HISTORY — PX: AUGMENTATION MAMMAPLASTY: SUR837

## 2022-03-05 ENCOUNTER — Other Ambulatory Visit: Payer: Self-pay | Admitting: Obstetrics and Gynecology

## 2022-03-05 DIAGNOSIS — R921 Mammographic calcification found on diagnostic imaging of breast: Secondary | ICD-10-CM

## 2022-03-08 ENCOUNTER — Other Ambulatory Visit: Payer: Self-pay | Admitting: Obstetrics and Gynecology

## 2022-03-08 DIAGNOSIS — R921 Mammographic calcification found on diagnostic imaging of breast: Secondary | ICD-10-CM

## 2022-03-24 ENCOUNTER — Other Ambulatory Visit: Payer: Self-pay | Admitting: Obstetrics and Gynecology

## 2022-03-24 ENCOUNTER — Ambulatory Visit
Admission: RE | Admit: 2022-03-24 | Discharge: 2022-03-24 | Disposition: A | Discharge status: Home / Self Care | Payer: BC Managed Care – PPO | Source: Ambulatory Visit | Attending: Obstetrics and Gynecology | Admitting: Obstetrics and Gynecology

## 2022-03-24 ENCOUNTER — Other Ambulatory Visit: Payer: Self-pay

## 2022-03-24 DIAGNOSIS — R921 Mammographic calcification found on diagnostic imaging of breast: Secondary | ICD-10-CM

## 2024-04-09 ENCOUNTER — Encounter: Payer: Self-pay | Admitting: Internal Medicine
